# Patient Record
Sex: Male | Born: 1980 | Race: Black or African American | Hispanic: No | Marital: Married | Smoking: Never smoker
Health system: Southern US, Community
[De-identification: ages and names within clinical notes are randomized; demographics above are authoritative.]

## PROBLEM LIST (undated history)

## (undated) DIAGNOSIS — G8929 Other chronic pain: Secondary | ICD-10-CM

## (undated) DIAGNOSIS — Z973 Presence of spectacles and contact lenses: Secondary | ICD-10-CM

## (undated) DIAGNOSIS — T7840XA Allergy, unspecified, initial encounter: Secondary | ICD-10-CM

## (undated) DIAGNOSIS — R7303 Prediabetes: Secondary | ICD-10-CM

## (undated) HISTORY — DX: Presence of spectacles and contact lenses: Z97.3

## (undated) HISTORY — DX: Allergy, unspecified, initial encounter: T78.40XA

---
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
## 2005-06-18 ENCOUNTER — Emergency Department: Payer: Self-pay | Admitting: Unknown Physician Specialty

## 2005-06-18 ENCOUNTER — Other Ambulatory Visit: Payer: Self-pay

## 2005-07-04 ENCOUNTER — Ambulatory Visit: Payer: Self-pay | Admitting: Internal Medicine

## 2010-01-05 ENCOUNTER — Emergency Department: Payer: Self-pay | Admitting: Unknown Physician Specialty

## 2014-03-24 ENCOUNTER — Emergency Department (HOSPITAL_COMMUNITY): Payer: BC Managed Care – PPO

## 2014-03-24 ENCOUNTER — Encounter (HOSPITAL_COMMUNITY): Payer: Self-pay | Admitting: Emergency Medicine

## 2014-03-24 ENCOUNTER — Emergency Department (HOSPITAL_COMMUNITY)
Admission: EM | Admit: 2014-03-24 | Discharge: 2014-03-24 | Disposition: A | Discharge status: Home / Self Care | Payer: BC Managed Care – PPO | Attending: Family Medicine | Admitting: Family Medicine

## 2014-03-24 DIAGNOSIS — Y9389 Activity, other specified: Secondary | ICD-10-CM | POA: Insufficient documentation

## 2014-03-24 DIAGNOSIS — Z79899 Other long term (current) drug therapy: Secondary | ICD-10-CM | POA: Insufficient documentation

## 2014-03-24 DIAGNOSIS — X500XXA Overexertion from strenuous movement or load, initial encounter: Secondary | ICD-10-CM | POA: Insufficient documentation

## 2014-03-24 DIAGNOSIS — S39012A Strain of muscle, fascia and tendon of lower back, initial encounter: Secondary | ICD-10-CM

## 2014-03-24 DIAGNOSIS — S335XXA Sprain of ligaments of lumbar spine, initial encounter: Secondary | ICD-10-CM | POA: Diagnosis not present

## 2014-03-24 DIAGNOSIS — Y929 Unspecified place or not applicable: Secondary | ICD-10-CM | POA: Insufficient documentation

## 2014-03-24 DIAGNOSIS — IMO0002 Reserved for concepts with insufficient information to code with codable children: Secondary | ICD-10-CM | POA: Insufficient documentation

## 2014-03-24 MED ORDER — METAXALONE 800 MG PO TABS: 800.0000 mg | ORAL_TABLET | Freq: Three times a day (TID) | ORAL | Status: DC

## 2014-03-24 MED ORDER — KETOROLAC TROMETHAMINE 30 MG/ML IJ SOLN: 30.0000 mg | Freq: Once | INTRAMUSCULAR | Status: DC

## 2014-03-24 MED ORDER — METHYLPREDNISOLONE 4 MG PO KIT: PACK | ORAL | Status: DC

## 2014-03-24 NOTE — ED Provider Notes (Signed)
CSN: [id number]     Arrival date & time 03/24/14  0935 History   First MD Initiated Contact with Patient 03/24/14 [phone]     Chief Complaint  Patient presents with  . Back Pain     (Consider location/radiation/quality/duration/timing/severity/associated sxs/prior Treatment) Patient is a 33 y.o. male presenting with back pain. The history is provided by the patient.  Back Pain Location:  Lumbar spine Quality:  Shooting Radiates to:  L posterior upper leg Pain severity:  Moderate Onset quality:  Sudden Duration:  1 week Progression:  Worsening Chronicity:  Recurrent Context: lifting heavy objects and occupational injury   Context comment:  Felt twinge in back on left , now into left leg.not to foot. Associated symptoms: leg pain   Associated symptoms: no abdominal pain, no bladder incontinence, no bowel incontinence, no dysuria, no numbness, no paresthesias, no tingling and no weakness     History reviewed. No pertinent past medical history. History reviewed. No pertinent past surgical history. History reviewed. No pertinent family history. History  Substance Use Topics  . Smoking status: Never Smoker   . Smokeless tobacco: Not on file  . Alcohol Use: No    Review of Systems  Constitutional: Negative.   Gastrointestinal: Negative.  Negative for nausea, vomiting, abdominal pain and bowel incontinence.  Genitourinary: Negative for bladder incontinence, dysuria, hematuria and flank pain.  Musculoskeletal: Positive for back pain and gait problem. Negative for joint swelling and neck pain.  Skin: Negative.   Neurological: Negative for tingling, weakness, numbness and paresthesias.      Allergies  Review of patient's allergies indicates no known allergies.  Home Medications   Prior to Admission medications   Medication Sig Start Date End Date Taking? Authorizing Provider  metaxalone (SKELAXIN) 800 MG tablet Take 1 tablet (800 mg total) by mouth 3 (three) times daily.  Muscle relaxer 03/24/14   Linna Hoff, MD  methylPREDNISolone (MEDROL DOSEPAK) 4 MG tablet follow package directions, start on wed, take until finished 03/24/14   Linna Hoff, MD   BP 132/95  Pulse 63  Temp(Src) 98.6 F (37 C) (Oral)  Resp 18  Ht  (2.057 m)  Wt 315 lb (142.883 kg)  BMI 33.77 kg/m2  SpO2 100% Physical Exam  Nursing note and vitals reviewed. Constitutional: He is oriented to person, place, and time. He appears well-developed and well-nourished.  Musculoskeletal: He exhibits tenderness.       Lumbar back: He exhibits decreased range of motion, tenderness, pain and spasm. He exhibits no bony tenderness, no swelling and normal pulse.       Back:  Neurological: He is alert and oriented to person, place, and time.  Skin: Skin is warm and dry.    ED Course  Procedures (including critical care time) Labs Review Labs Reviewed - No data to display  Imaging Review No results found.   EKG Interpretation None      MDM   Final diagnoses:  Strain of lumbar paraspinal muscle, initial encounter        Linna Hoff, MD 03/31/14 1245

## 2014-03-24 NOTE — ED Provider Notes (Signed)
CSN: [id number]     Arrival date & time 03/24/14  0935 History   First MD Initiated Contact with Patient 03/24/14 [phone]     Chief Complaint  Patient presents with  . Back Pain     (Consider location/radiation/quality/duration/timing/severity/associated sxs/prior Treatment) HPI  History reviewed. No pertinent past medical history. History reviewed. No pertinent past surgical history. History reviewed. No pertinent family history. History  Substance Use Topics  . Smoking status: Never Smoker   . Smokeless tobacco: Not on file  . Alcohol Use: No    Review of Systems    Allergies  Review of patient's allergies indicates no known allergies.  Home Medications   Prior to Admission medications   Medication Sig Start Date End Date Taking? Authorizing Provider  metaxalone (SKELAXIN) 800 MG tablet Take 1 tablet (800 mg total) by mouth 3 (three) times daily. Muscle relaxer 03/24/14   Linna Hoff, MD  methylPREDNISolone (MEDROL DOSEPAK) 4 MG tablet follow package directions, start on wed, take until finished 03/24/14   Linna Hoff, MD   BP 129/81  Pulse 73  Temp(Src) 98.6 F (37 C) (Oral)  Resp 19  Ht  (2.057 m)  Wt 315 lb (142.883 kg)  BMI 33.77 kg/m2  SpO2 97% Physical Exam  ED Course  Procedures (including critical care time) Labs Review Labs Reviewed - No data to display  Imaging Review Dg Lumbar Spine Complete  03/24/2014   CLINICAL DATA:  Low back pain with radicular symptoms  EXAM: LUMBAR SPINE - COMPLETE 4+ VIEW  COMPARISON:  None.  FINDINGS: Frontal, lateral, spot lumbosacral lateral, and bilateral oblique views were obtained. There are 5 non-rib-bearing lumbar type vertebral bodies. There is no fracture or spondylolisthesis. Disc spaces appear intact. There is no appreciable facet arthropathy.  IMPRESSION: No fracture or appreciable arthropathy.   Electronically Signed   By: Bretta Bang M.D.   On: 03/24/2014 10:22  X-rays reviewed and report per  radiologist.    EKG Interpretation None      MDM   Final diagnoses:  Strain of lumbar paraspinal muscle, initial encounter        Linna Hoff, MD 03/24/14 1132

## 2014-03-24 NOTE — ED Notes (Signed)
Pt reports lower back pain for over a week since heavy lifting. Pain radiates into left leg, reports hx of back problems. Ambulatory at triage.

## 2014-03-24 NOTE — ED Notes (Signed)
C/o lower back pain radiating to left buttock and leg. Denies numbness or tingling.

## 2014-03-25 NOTE — ED Provider Notes (Signed)
CSN: [id number]     Arrival date & time 03/24/14  0935 History   First MD Initiated Contact with Patient 03/24/14 [phone]     Chief Complaint  Patient presents with  . Back Pain     (Consider location/radiation/quality/duration/timing/severity/associated sxs/prior Treatment) HPI  History reviewed. No pertinent past medical history. History reviewed. No pertinent past surgical history. History reviewed. No pertinent family history. History  Substance Use Topics  . Smoking status: Never Smoker   . Smokeless tobacco: Not on file  . Alcohol Use: No    Review of Systems    Allergies  Review of patient's allergies indicates no known allergies.  Home Medications   Prior to Admission medications   Medication Sig Start Date End Date Taking? Authorizing Provider  metaxalone (SKELAXIN) 800 MG tablet Take 1 tablet (800 mg total) by mouth 3 (three) times daily. Muscle relaxer 03/24/14   Linna Hoff, MD  methylPREDNISolone (MEDROL DOSEPAK) 4 MG tablet follow package directions, start on wed, take until finished 03/24/14   Linna Hoff, MD   BP 132/95  Pulse 63  Temp(Src) 98.6 F (37 C) (Oral)  Resp 18  Ht  (2.057 m)  Wt 315 lb (142.883 kg)  BMI 33.77 kg/m2  SpO2 100% Physical Exam  ED Course  Procedures (including critical care time) Labs Review Labs Reviewed - No data to display  Imaging Review Dg Lumbar Spine Complete  03/24/2014   CLINICAL DATA:  Low back pain with radicular symptoms  EXAM: LUMBAR SPINE - COMPLETE 4+ VIEW  COMPARISON:  None.  FINDINGS: Frontal, lateral, spot lumbosacral lateral, and bilateral oblique views were obtained. There are 5 non-rib-bearing lumbar type vertebral bodies. There is no fracture or spondylolisthesis. Disc spaces appear intact. There is no appreciable facet arthropathy.  IMPRESSION: No fracture or appreciable arthropathy.   Electronically Signed   By: Bretta Bang M.D.   On: 03/24/2014 10:22     EKG Interpretation None       MDM   Final diagnoses:  Strain of lumbar paraspinal muscle, initial encounter        Linna Hoff, MD 03/25/14 [phone]

## 2015-09-12 IMAGING — CR DG LUMBAR SPINE COMPLETE 4+V
5 series · 5 of 5 positions shown · non-contrast
Comparison: None.

CLINICAL DATA: Low back pain with radicular symptoms

EXAM:
LUMBAR SPINE - COMPLETE 4+ VIEW

[t l-spine a.p.]
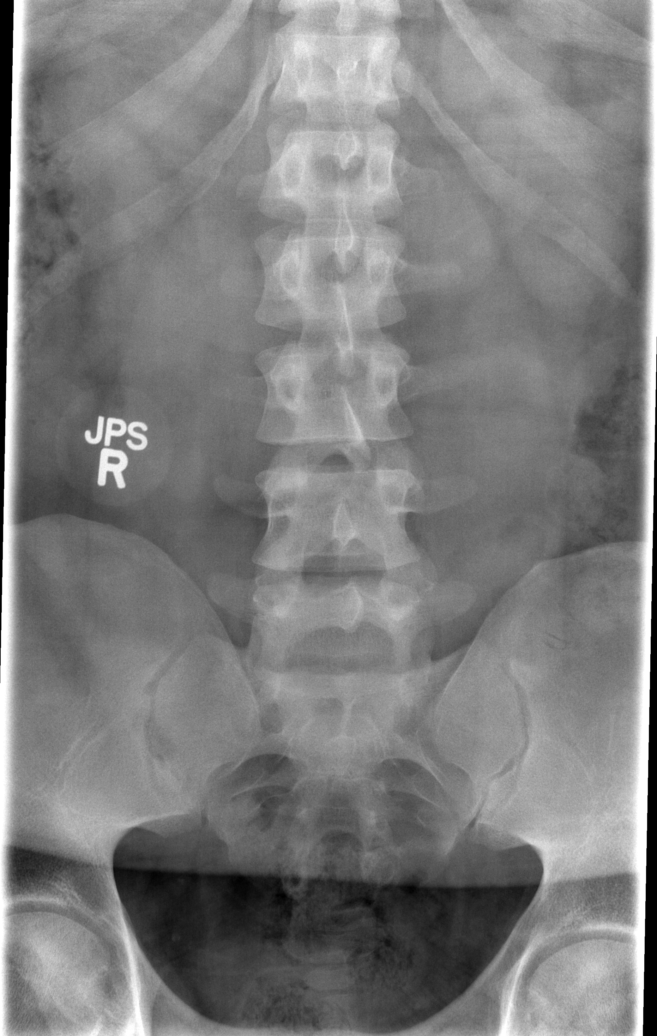

[t l-spine oblique exposure (1 of 2)]
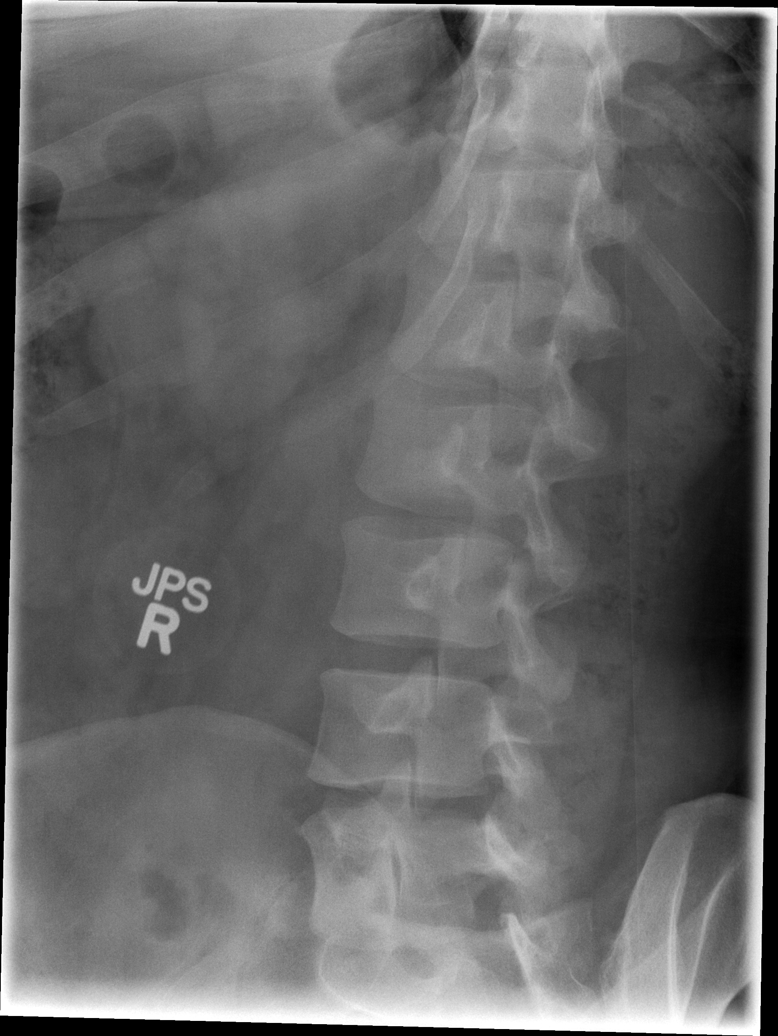

[t l-spine oblique exposure (2 of 2)]
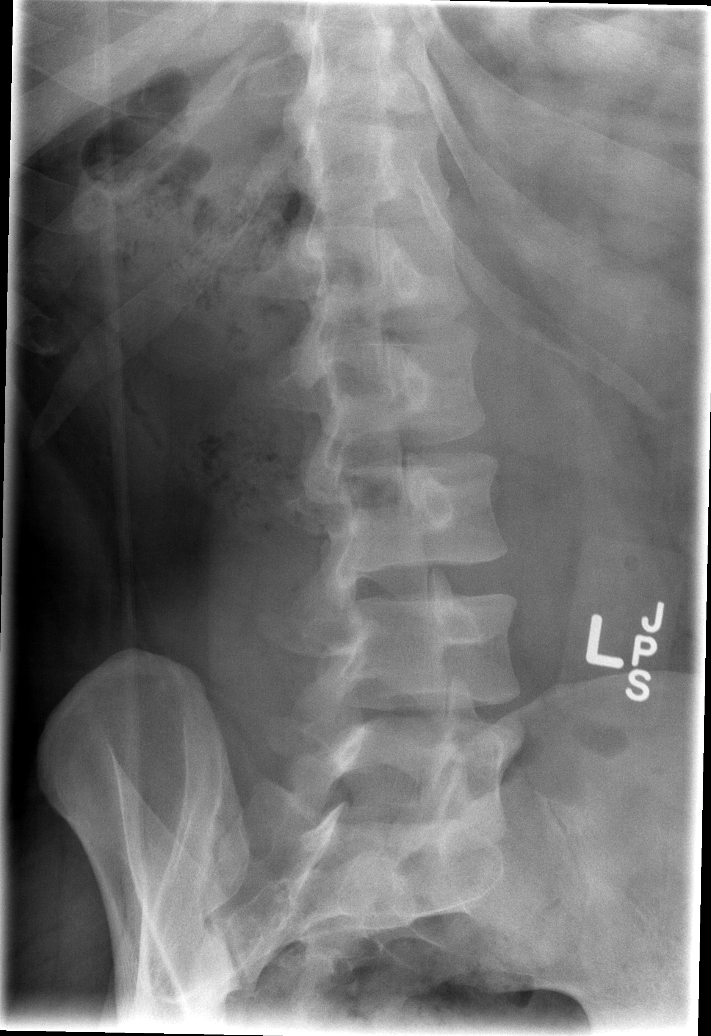

[t l-spine lat]
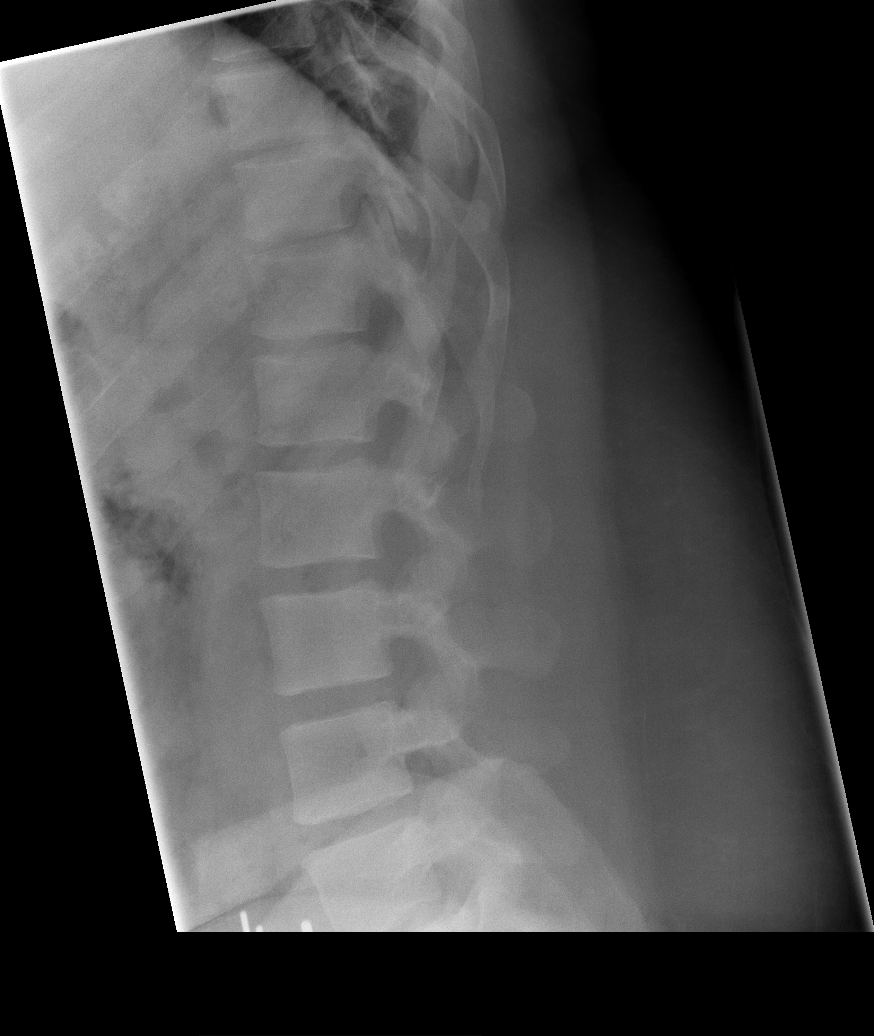

[t l-spine l5-s1 spot]
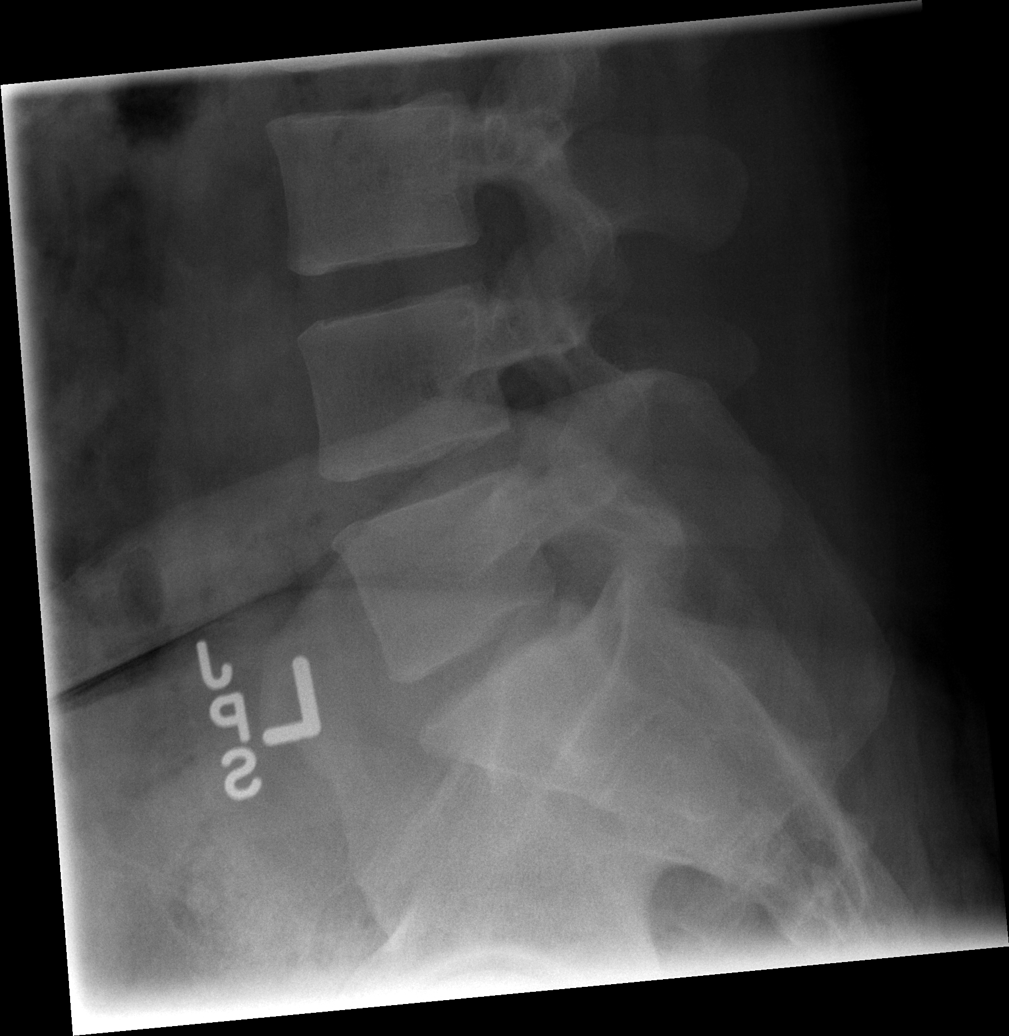

[5 of 5 positions shown; findings below may reference images not displayed]

FINDINGS: Frontal, lateral, spot lumbosacral lateral, and bilateral oblique
views were obtained. There are 5 non-rib-bearing lumbar type
vertebral bodies. There is no fracture or spondylolisthesis. Disc
spaces appear intact. There is no appreciable facet arthropathy.
IMPRESSION: No fracture or appreciable arthropathy.

## 2016-01-03 ENCOUNTER — Encounter (HOSPITAL_COMMUNITY): Payer: Self-pay | Admitting: Emergency Medicine

## 2016-01-03 ENCOUNTER — Emergency Department (HOSPITAL_COMMUNITY)
Admission: EM | Admit: 2016-01-03 | Discharge: 2016-01-03 | Disposition: A | Discharge status: Home / Self Care | Payer: BC Managed Care – PPO | Attending: Emergency Medicine | Admitting: Emergency Medicine

## 2016-01-03 DIAGNOSIS — M545 Low back pain, unspecified: Secondary | ICD-10-CM

## 2016-01-03 MED ORDER — CYCLOBENZAPRINE HCL 10 MG PO TABS: 10.0000 mg | ORAL_TABLET | Freq: Two times a day (BID) | ORAL | Status: DC | PRN

## 2016-01-03 MED ORDER — IBUPROFEN 800 MG PO TABS
800.0000 mg | ORAL_TABLET | Freq: Once | ORAL | Status: AC
  Administered 2016-01-03: 800 mg via ORAL
  Filled 2016-01-03: qty 1

## 2016-01-03 NOTE — Discharge Instructions (Signed)
Back Exercises °The following exercises strengthen the muscles that help to support the back. They also help to keep the lower back flexible. Doing these exercises can help to prevent back pain or lessen existing pain. °If you have back pain or discomfort, try doing these exercises 2-3 times each day or as told by your health care provider. When the pain goes away, do them once each day, but increase the number of times that you repeat the steps for each exercise (do more repetitions). If you do not have back pain or discomfort, do these exercises once each day or as told by your health care provider. °EXERCISES °Single Knee to Chest °Repeat these steps 3-5 times for each leg: °· Lie on your back on a firm bed or the floor with your legs extended. °· Bring one knee to your chest. Your other leg should stay extended and in contact with the floor. °· Hold your knee in place by grabbing your knee or thigh. °· Pull on your knee until you feel a gentle stretch in your lower back. °· Hold the stretch for 10-30 seconds. °· Slowly release and straighten your leg. °Pelvic Tilt °Repeat these steps 5-10 times: °· Lie on your back on a firm bed or the floor with your legs extended. °· Bend your knees so they are pointing toward the ceiling and your feet are flat on the floor. °· Tighten your lower abdominal muscles to press your lower back against the floor. This motion will tilt your pelvis so your tailbone points up toward the ceiling instead of pointing to your feet or the floor. °· With gentle tension and even breathing, hold this position for 5-10 seconds. °Cat-Cow °Repeat these steps until your lower back becomes more flexible: °· Get into a hands-and-knees position on a firm surface. Keep your hands under your shoulders, and keep your knees under your hips. You may place padding under your knees for comfort. °· Let your head hang down, and point your tailbone toward the floor so your lower back becomes rounded like the  back of a cat. °· Hold this position for 5 seconds. °· Slowly lift your head and point your tailbone up toward the ceiling so your back forms a sagging arch like the back of a cow. °· Hold this position for 5 seconds. °Press-Ups °Repeat these steps 5-10 times: °· Lie on your abdomen (face-down) on the floor. °· Place your palms near your head, about shoulder-width apart. °· While you keep your back as relaxed as possible and keep your hips on the floor, slowly straighten your arms to raise the top half of your body and lift your shoulders. Do not use your back muscles to raise your upper torso. You may adjust the placement of your hands to make yourself more comfortable. °· Hold this position for 5 seconds while you keep your back relaxed. °· Slowly return to lying flat on the floor. °Bridges °Repeat these steps 10 times: °1. Lie on your back on a firm surface. °2. Bend your knees so they are pointing toward the ceiling and your feet are flat on the floor. °3. Tighten your buttocks muscles and lift your buttocks off of the floor until your waist is at almost the same height as your knees. You should feel the muscles working in your buttocks and the back of your thighs. If you do not feel these muscles, slide your feet 1-2 inches farther away from your buttocks. °4. Hold this position for 3-5   seconds. °5. Slowly lower your hips to the starting position, and allow your buttocks muscles to relax completely. °If this exercise is too easy, try doing it with your arms crossed over your chest. °Abdominal Crunches °Repeat these steps 5-10 times: °1. Lie on your back on a firm bed or the floor with your legs extended. °2. Bend your knees so they are pointing toward the ceiling and your feet are flat on the floor. °3. Cross your arms over your chest. °4. Tip your chin slightly toward your chest without bending your neck. °5. Tighten your abdominal muscles and slowly raise your trunk (torso) high enough to lift your shoulder  blades a tiny bit off of the floor. Avoid raising your torso higher than that, because it can put too much stress on your low back and it does not help to strengthen your abdominal muscles. °6. Slowly return to your starting position. °Back Lifts °Repeat these steps 5-10 times: °1. Lie on your abdomen (face-down) with your arms at your sides, and rest your forehead on the floor. °2. Tighten the muscles in your legs and your buttocks. °3. Slowly lift your chest off of the floor while you keep your hips pressed to the floor. Keep the back of your head in line with the curve in your back. Your eyes should be looking at the floor. °4. Hold this position for 3-5 seconds. °5. Slowly return to your starting position. °SEEK MEDICAL CARE IF: °· Your back pain or discomfort gets much worse when you do an exercise. °· Your back pain or discomfort does not lessen within 2 hours after you exercise. °If you have any of these problems, stop doing these exercises right away. Do not do them again unless your health care provider says that you can. °SEEK IMMEDIATE MEDICAL CARE IF: °· You develop sudden, severe back pain. If this happens, stop doing the exercises right away. Do not do them again unless your health care provider says that you can. °  °This information is not intended to replace advice given to you by your health care provider. Make sure you discuss any questions you have with your health care provider. °  °Document Released: 08/24/2004 Document Revised: 04/07/2015 Document Reviewed: 09/10/2014 °Elsevier Interactive Patient Education ©2016 [employer] ° °Back Pain, Adult °Back pain is very common in adults. The cause of back pain is rarely dangerous and the pain often gets better over time. The cause of your back pain may not be known. Some common causes of back pain include: °· Strain of the muscles or ligaments supporting the spine. °· Wear and tear (degeneration) of the spinal disks. °· Arthritis. °· Direct injury  to the back. °For many people, back pain may return. Since back pain is rarely dangerous, most people can learn to manage this condition on their own. °HOME CARE INSTRUCTIONS °Watch your back pain for any changes. The following actions may help to lessen any discomfort you are feeling: °· Remain active. It is stressful on your back to sit or stand in one place for long periods of time. Do not sit, drive, or stand in one place for more than 30 minutes at a time. Take short walks on even surfaces as soon as you are able. Try to increase the length of time you walk each day. °· Exercise regularly as directed by your health care provider. Exercise helps your back heal faster. It also helps avoid future injury by keeping your muscles strong and flexible. °· Do not stay in   bed. Resting more than 1-2 days can delay your recovery. °· Pay attention to your body when you bend and lift. The most comfortable positions are those that put less stress on your recovering back. Always use proper lifting techniques, including: °¨ Bending your knees. °¨ Keeping the load close to your body. °¨ Avoiding twisting. °· Find a comfortable position to sleep. Use a firm mattress and lie on your side with your knees slightly bent. If you lie on your back, put a pillow under your knees. °· Avoid feeling anxious or stressed. Stress increases muscle tension and can worsen back pain. It is important to recognize when you are anxious or stressed and learn ways to manage it, such as with exercise. °· Take medicines only as directed by your health care provider. Over-the-counter medicines to reduce pain and inflammation are often the most helpful. Your health care provider may prescribe muscle relaxant drugs. These medicines help dull your pain so you can more quickly return to your normal activities and healthy exercise. °· Apply ice to the injured area: °¨ Put ice in a plastic bag. °¨ Place a towel between your skin and the bag. °¨ Leave the ice on  for 20 minutes, 2-3 times a day for the first 2-3 days. After that, ice and heat may be alternated to reduce pain and spasms. °· Maintain a healthy weight. Excess weight puts extra stress on your back and makes it difficult to maintain good posture. °SEEK MEDICAL CARE IF: °· You have pain that is not relieved with rest or medicine. °· You have increasing pain going down into the legs or buttocks. °· You have pain that does not improve in one week. °· You have night pain. °· You lose weight. °· You have a fever or chills. °SEEK IMMEDIATE MEDICAL CARE IF:  °· You develop new bowel or bladder control problems. °· You have unusual weakness or numbness in your arms or legs. °· You develop nausea or vomiting. °· You develop abdominal pain. °· You feel faint. °  °This information is not intended to replace advice given to you by your health care provider. Make sure you discuss any questions you have with your health care provider. °  °Document Released: 07/17/2005 Document Revised: 08/07/2014 Document Reviewed: 11/18/2013 °Elsevier Interactive Patient Education ©2016 [employer] ° °

## 2016-01-03 NOTE — ED Notes (Signed)
Pt c/o left low back pain x 1 wk that radiates down left leg.  States that "this happens every once in a while but this time it won't go away".  Denies any other symptoms.
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
## 2019-04-01 HISTORY — PX: NO PAST SURGERIES: SHX2092

## 2019-04-03 ENCOUNTER — Other Ambulatory Visit: Payer: Self-pay

## 2019-04-03 ENCOUNTER — Encounter: Payer: Self-pay | Admitting: Medical

## 2019-04-03 ENCOUNTER — Ambulatory Visit: Payer: Managed Care, Other (non HMO) | Admitting: Medical

## 2019-04-03 VITALS — BP 110/88 | HR 98 | Temp 98.0°F | Ht >= 80 in | Wt 348.8 lb

## 2019-04-03 DIAGNOSIS — E669 Obesity, unspecified: Secondary | ICD-10-CM | POA: Diagnosis not present

## 2019-04-03 DIAGNOSIS — M549 Dorsalgia, unspecified: Secondary | ICD-10-CM

## 2019-04-03 DIAGNOSIS — Z7189 Other specified counseling: Secondary | ICD-10-CM | POA: Diagnosis not present

## 2019-04-03 DIAGNOSIS — G8929 Other chronic pain: Secondary | ICD-10-CM

## 2019-04-03 DIAGNOSIS — Z0000 Encounter for general adult medical examination without abnormal findings: Secondary | ICD-10-CM

## 2019-04-03 DIAGNOSIS — Z7185 Encounter for immunization safety counseling: Secondary | ICD-10-CM

## 2019-04-03 NOTE — Patient Instructions (Signed)
Thanks for trusting us with your health care and for coming in for a physical today.  Exercise: I recommend exercising most days of the week using a type of exercise that you would enjoy and stick to such as walking, running, swimming, hiking, biking, aerobics, etc. This needs to be at least 30-40 minutes at a time, at least 5 days/week with moderate intensity.  This would be 60-70% of your maximum heart rate.  For example, your maximal heart rate would be 200- your age, multiplied x 0.6 to equal 60% of your maximal heart rate.    Thus, a person age 37 would have a maximum heart rate of 160.  60% of this would be 96 beats per minute.  Thus for fat burning exercise, a 38 year old would want to exercise has sustained heart rate of about 96 bpm for fat burning.  However for heart health, they would want to exercise at a higher heart rate of about 75% of maximum heart rate which would be a heart rate of about 120 beats per minute.  So I recommend a combination of doing some exercise at moderate intensity, and some exercise at vigorous intensity for heart health.   Low Carb Diet recommendations:  I recommend you drink water throughout the day.   70 ounces or 2 liters would be a good amount.  If you have been accustomed to drinking juice or soda, try water with lemon or water with lime, or try using no calorie flavor dropper.  I recommend the following as an example meal plan that includes 3 meals per day.   You can skip some meals periodically for intermittent fasting.  Breakfast (choose one): Marland Kitchen. Omelette with egg, can include a little bit of cheese, your choice of mushrooms, peppers, onions, salsa . Smoothie with handful of spinach or kale, 1 cup of milk or water, 1 cup of berries, 1 packet of artificial sweetener such as stevia . Yogurt with fruit   Mid morning snack: . 1 fruit serving and 1 protein such as 8 almonds or 8 nuts, or vegetable such as carrots and humus or other similar  vegetable   Lunch: . Salad with 3-4 ounces of lean grilled or baked meat such as fish, chicken or [place]   Mid afternoon snack: . 1 fruit serving and 1 protein such as 8 almonds or 8 nuts, or vegetable such as carrots and humus or other similar vegetable   Dinner: . Large serving of vegetables and 3-4 ounces of lean grilled or baked meat such as fish, chicken or [place] . Or vegetarian dish without meat    Avoid  . Chips, cookies, cake, donuts, soda, sweet tea, juices, candy, fast food . For now , avoid, or significantly limit grains        Below are some general recommendations I have for you:  Yearly screenings See your eye doctor yearly for routine vision care. See your dentist yearly for routine dental care including hygiene visits twice yearly. See me here yearly for a routine physical and preventative care visit    Please follow up yearly for a physical.   Preventative Care for Adults - Male      MAINTAIN REGULAR HEALTH EXAMS:  A routine yearly physical is a good way to check in with your primary care provider about your health and preventive screening. It is also an opportunity to share updates about your health and any concerns you have, and receive a thorough all-over exam.   Most health  insurance companies pay for at least some preventative services.  Check with your health plan for specific coverages.  WHAT PREVENTATIVE SERVICES DO MEN NEED?  Adult men should have their weight and blood pressure checked regularly.   Men age 55 and older should have their cholesterol levels checked regularly.  Beginning at age 16 and continuing to age 82, men should be screened for colorectal cancer.  Certain people may need continued testing until age 56.  Updating vaccinations is part of preventative care.  Vaccinations help protect against diseases such as the flu.  Osteoporosis is a disease in which the bones lose minerals and strength as we age. Men ages 68 and  over should discuss this with their caregivers  Lab tests are generally done as part of preventative care to screen for anemia and blood disorders, to screen for problems with the kidneys and liver, to screen for bladder problems, to check blood sugar, and to check your cholesterol level.  Preventative services generally include counseling about diet, exercise, avoiding tobacco, drugs, excessive alcohol consumption, and sexually transmitted infections.    GENERAL RECOMMENDATIONS FOR GOOD HEALTH:  Healthy diet:  Eat a variety of foods, including fruit, vegetables, animal or vegetable protein, such as meat, fish, chicken, and eggs, or beans, lentils, tofu, and grains, such as rice.  Drink plenty of water daily.  Decrease saturated fat in the diet, avoid lots of red meat, processed foods, sweets, fast foods, and fried foods.  Exercise:  Aerobic exercise helps maintain good heart health. At least 30-40 minutes of moderate-intensity exercise is recommended. For example, a brisk walk that increases your heart rate and breathing. This should be done on most days of the week.   Find a type of exercise or a variety of exercises that you enjoy so that it becomes a part of your daily life.  Examples are running, walking, swimming, water aerobics, and biking.  For motivation and support, explore group exercise such as aerobic class, spin class, Zumba, Yoga,or  martial arts, etc.    Set exercise goals for yourself, such as a certain weight goal, walk or run in a race such as a 5k walk/run.  Speak to your primary care provider about exercise goals.  Disease prevention:  If you smoke or chew tobacco, find out from your caregiver how to quit. It can literally save your life, no matter how long you have been a tobacco user. If you do not use tobacco, never begin.   Maintain a healthy diet and normal weight. Increased weight leads to problems with blood pressure and diabetes.   The Body Mass Index or BMI  is a way of measuring how much of your body is fat. Having a BMI above 27 increases the risk of heart disease, diabetes, hypertension, stroke and other problems related to obesity. Your caregiver can help determine your BMI and based on it develop an exercise and dietary program to help you achieve or maintain this important measurement at a healthful level.  High blood pressure causes heart and blood vessel problems.  Persistent high blood pressure should be treated with medicine if weight loss and exercise do not work.   Fat and cholesterol leaves deposits in your arteries that can block them. This causes heart disease and vessel disease elsewhere in your body.  If your cholesterol is found to be high, or if you have heart disease or certain other medical conditions, then you may need to have your cholesterol monitored frequently and be treated  with medication.   Ask if you should have a cardiac stress test if your history suggests this. A stress test is a test done on a treadmill that looks for heart disease. This test can find disease prior to there being a problem.  Osteoporosis is a disease in which the bones lose minerals and strength as we age. This can result in serious bone fractures. Risk of osteoporosis can be identified using a bone density scan. Men ages 2965 and over should discuss this with their caregivers. Ask your caregiver whether you should be taking a calcium supplement and Vitamin D, to reduce the rate of osteoporosis.   Avoid drinking alcohol in excess (more than two drinks per day).  Avoid use of street drugs. Do not share needles with anyone. Ask for professional help if you need assistance or instructions on stopping the use of alcohol, cigarettes, and/or drugs.  Brush your teeth twice a day with fluoride toothpaste, and floss once a day. Good oral hygiene prevents tooth decay and gum disease. The problems can be painful, unattractive, and can cause other health problems. Visit  your dentist for a routine oral and dental check up and preventive care every 6-12 months.   Look at your skin regularly.  Use a mirror to look at your back. Notify your caregivers of changes in moles, especially if there are changes in shapes, colors, a size larger than a pencil eraser, an irregular border, or development of new moles.  Safety:  Use seatbelts 100% of the time, whether driving or as a passenger.  Use safety devices such as hearing protection if you work in environments with loud noise or significant background noise.  Use safety glasses when doing any work that could send debris in to the eyes.  Use a helmet if you ride a bike or motorcycle.  Use appropriate safety gear for contact sports.  Talk to your caregiver about gun safety.  Use sunscreen with a SPF (or skin protection factor) of 15 or greater.  Lighter skinned people are at a greater risk of skin cancer. Don't forget to also wear sunglasses in order to protect your eyes from too much damaging sunlight. Damaging sunlight can accelerate cataract formation.   Practice safe sex. Use condoms. Condoms are used for birth control and to help reduce the spread of sexually transmitted infections (or STIs).  Some of the STIs are gonorrhea (the clap), chlamydia, syphilis, trichomonas, herpes, HPV (human papilloma virus) and HIV (human immunodeficiency virus) which causes AIDS. The herpes, HIV and HPV are viral illnesses that have no cure. These can result in disability, cancer and death.   Keep carbon monoxide and smoke detectors in your home functioning at all times. Change the batteries every 6 months or use a model that plugs into the wall.   Vaccinations:  Stay up to date with your tetanus shots and other required immunizations. You should have a booster for tetanus every 10 years. Be sure to get your flu shot every year, since 5%-20% of the [place] population comes down with the flu. The flu vaccine changes each year, so being  vaccinated once is not enough. Get your shot in the fall, before the flu season peaks.   Other vaccines to consider:  Human Papilloma Virus or HPV causes cancer of the cervix, and other infections that can be transmitted from person to person. There is a vaccine for HPV, and males should get immunized between the ages of 4711 and 3826. It requires a  series of 3 shots.   Pneumococcal vaccine to protect against certain types of pneumonia.  This is normally recommended for adults age 67 or older.  However, adults younger than 37 years old with certain underlying conditions such as diabetes, heart or lung disease should also receive the vaccine.  Shingles vaccine to protect against Varicella Zoster if you are older than age 52, or younger than 37 years old with certain underlying illness.  If you have not had the Shingrix vaccine, please call your insurer to inquire about coverage for the Shingrix vaccine given in 2 doses.   Some insurers cover this vaccine after age 24, some cover this after age 1.  If your insurer covers this, then call to schedule appointment to have this vaccine here  Hepatitis A vaccine to protect against a form of infection of the liver by a virus acquired from food.  Hepatitis B vaccine to protect against a form of infection of the liver by a virus acquired from blood or body fluids, particularly if you work in health care.  If you plan to travel internationally, check with your local health department for specific vaccination recommendations.   What should I know about cancer screening? Many types of cancers can be detected early and may often be prevented. Lung Cancer  You should be screened every year for lung cancer if: ? You are a current smoker who has smoked for at least 30 years. ? You are a former smoker who has quit within the past 15 years.  Talk to your health care provider about your screening options, when you should start screening, and how often you should be  screened.  Colorectal Cancer  Routine colorectal cancer screening usually begins at 37 years of age and should be repeated every 5-10 years until you are 37 years old. You may need to be screened more often if early forms of precancerous polyps or small growths are found. Your health care provider may recommend screening at an earlier age if you have risk factors for colon cancer.  Your health care provider may recommend using home test kits to check for hidden blood in the stool.  A small camera at the end of a tube can be used to examine your colon (sigmoidoscopy or colonoscopy). This checks for the earliest forms of colorectal cancer.  Prostate and Testicular Cancer  Depending on your age and overall health, your health care provider may do certain tests to screen for prostate and testicular cancer.  Talk to your health care provider about any symptoms or concerns you have about testicular or prostate cancer.  Skin Cancer  Check your skin from head to toe regularly.  Tell your health care provider about any new moles or changes in moles, especially if: ? There is a change in a mole's size, shape, or color. ? You have a mole that is larger than a pencil eraser.  Always use sunscreen. Apply sunscreen liberally and repeat throughout the day.  Protect yourself by wearing long sleeves, pants, a wide-brimmed hat, and sunglasses when outside.
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
## 2019-04-04 LAB — COMPREHENSIVE METABOLIC PANEL
ALT: 23 IU/L (ref 0–44)
AST: 20 IU/L (ref 0–40)
Albumin/Globulin Ratio: 2.2 (ref 1.2–2.2)
Albumin: 4.8 g/dL (ref 4.0–5.0)
Alkaline Phosphatase: 63 IU/L (ref 39–117)
BUN/Creatinine Ratio: 12 (ref 9–20)
BUN: 13 mg/dL (ref 6–20)
Bilirubin Total: 0.6 mg/dL (ref 0.0–1.2)
CO2: 26 mmol/L (ref 20–29)
Calcium: 10 mg/dL (ref 8.7–10.2)
Chloride: 101 mmol/L (ref 96–106)
Creatinine, Ser: 1.13 mg/dL (ref 0.76–1.27)
GFR calc Af Amer: 95 mL/min/{1.73_m2} (ref >59)
GFR calc non Af Amer: 83 mL/min/{1.73_m2} (ref >59)
Globulin, Total: 2.2 g/dL (ref 1.5–4.5)
Glucose: 94 mg/dL (ref 65–99)
Potassium: 4.3 mmol/L (ref 3.5–5.2)
Sodium: 143 mmol/L (ref 134–144)
Total Protein: 7 g/dL (ref 6.0–8.5)

## 2019-04-04 LAB — CBC WITH DIFFERENTIAL/PLATELET
Basophils Absolute: 0.1 10*3/uL (ref 0.0–0.2)
Basos: 1 %
EOS (ABSOLUTE): 0.2 10*3/uL (ref 0.0–0.4)
Eos: 4 %
Hematocrit: 44 % (ref 37.5–51.0)
Hemoglobin: 14.7 g/dL (ref 13.0–17.7)
Immature Grans (Abs): 0 10*3/uL (ref 0.0–0.1)
Immature Granulocytes: 0 %
Lymphocytes Absolute: 2.5 10*3/uL (ref 0.7–3.1)
Lymphs: 42 %
MCH: 27.6 pg (ref 26.6–33.0)
MCHC: 33.4 g/dL (ref 31.5–35.7)
MCV: 83 fL (ref 79–97)
Monocytes Absolute: 0.6 10*3/uL (ref 0.1–0.9)
Monocytes: 10 %
Neutrophils Absolute: 2.7 10*3/uL (ref 1.4–7.0)
Neutrophils: 43 %
Platelets: 289 10*3/uL (ref 150–450)
RBC: 5.32 x10E6/uL (ref 4.14–5.80)
RDW: 13.5 % (ref 11.6–15.4)
WBC: 6 10*3/uL (ref 3.4–10.8)

## 2019-04-04 LAB — HEMOGLOBIN A1C
Est. average glucose Bld gHb Est-mCnc: 117 mg/dL
Hgb A1c MFr Bld: 5.7 % — ABNORMAL HIGH (ref 4.8–5.6)

## 2019-04-04 LAB — LIPID PANEL
Chol/HDL Ratio: 4.7 ratio (ref 0.0–5.0)
Cholesterol, Total: 197 mg/dL (ref 100–199)
HDL: 42 mg/dL (ref >39)
LDL Chol Calc (NIH): 136 mg/dL — ABNORMAL HIGH (ref 0–99)
Triglycerides: 107 mg/dL (ref 0–149)
VLDL Cholesterol Cal: 19 mg/dL (ref 5–40)

## 2019-04-04 LAB — TSH: TSH: 3.12 u[IU]/mL (ref 0.450–4.500)

## 2019-08-29 ENCOUNTER — Ambulatory Visit: Payer: Managed Care, Other (non HMO) | Admitting: Medical

## 2019-08-29 ENCOUNTER — Encounter: Payer: Self-pay | Admitting: Medical

## 2019-08-29 ENCOUNTER — Other Ambulatory Visit: Payer: Self-pay

## 2019-08-29 VITALS — BP 132/88 | HR 82 | Temp 98.0°F | Ht >= 80 in | Wt 352.4 lb

## 2019-08-29 DIAGNOSIS — M549 Dorsalgia, unspecified: Secondary | ICD-10-CM

## 2019-08-29 DIAGNOSIS — G8929 Other chronic pain: Secondary | ICD-10-CM

## 2019-08-29 DIAGNOSIS — E669 Obesity, unspecified: Secondary | ICD-10-CM | POA: Diagnosis not present

## 2019-08-29 DIAGNOSIS — E7800 Pure hypercholesterolemia, unspecified: Secondary | ICD-10-CM | POA: Diagnosis not present

## 2019-08-29 MED ORDER — QSYMIA 7.5-46 MG PO CP24: 1.0000 | ORAL_CAPSULE | ORAL | 0 refills | Status: DC
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
## 2019-12-16 ENCOUNTER — Other Ambulatory Visit: Payer: Self-pay

## 2019-12-16 ENCOUNTER — Encounter: Payer: Self-pay | Admitting: Medical

## 2019-12-16 ENCOUNTER — Ambulatory Visit: Payer: Managed Care, Other (non HMO) | Admitting: Medical

## 2019-12-16 VITALS — BP 120/80 | HR 65 | Temp 98.4°F | Ht >= 80 in | Wt 343.4 lb

## 2019-12-16 DIAGNOSIS — L729 Follicular cyst of the skin and subcutaneous tissue, unspecified: Secondary | ICD-10-CM | POA: Diagnosis not present

## 2019-12-16 NOTE — Progress Notes (Signed)
Subjective:  Grant Mays is a 39 y.o. male who presents for Chief Complaint  Patient presents with  . Mass    colar bone area left side      Here today for concern about lump under the skin.  He noticed this a few days ago on the left collarbone area.  His wife is on the phone speaker phone today to listen in.  He got the Covid vaccine 2 weeks ago  He does happen to have a itch on his left chest and felt the lump.  The lump is mobile.  It doesn't hurt unless he is pressing on it.  He denies any other lump of the chest wall breast or underarms.  He is a non-smoker.  Otherwise he feels fine.  He has been exercising of lately.  He didn't know if the exercise had caused this.  No other aggravating or relieving factors.    No other c/o.  The following portions of the patient's history were reviewed and updated as appropriate: allergies, current medications, past family history, past medical history, past social history, past surgical history and problem list.  ROS Otherwise as in subjective above  Objective: BP 120/80   Pulse 65   Temp 98.4 F (36.9 C)   Ht 6\' 8"  (2.032 m)   Wt (!) 343 lb 6.4 oz (155.8 kg)   SpO2 98%   BMI 37.72 kg/m   General appearance: alert, no distress, well developed, well nourished Just over the left collarbone approximately distal fifth is a mobile approximately 5 mm round cystic structure subcutaneous.  There is no erythema no tenderness no fluctuance no warmth.  It does not feel dense or mass like such as lymph node or mass, seems consistent with a benign cyst   Assessment: Encounter Diagnosis  Name Primary?  . Cyst of skin Yes     Plan: We discussed the findings that seems consistent with cyst in the subcutaneous tissue that is mobile and nontender.  It is benign appearing.  I reassured him  Tamara was seen today for mass.  Diagnoses and all orders for this visit:  Cyst of skin    Follow up: As needed

## 2020-03-25 ENCOUNTER — Telehealth: Payer: Self-pay | Admitting: Medical

## 2020-03-25 ENCOUNTER — Encounter: Payer: Managed Care, Other (non HMO) | Admitting: Medical

## 2020-03-25 NOTE — Telephone Encounter (Signed)
Forwarding

## 2020-03-25 NOTE — Telephone Encounter (Signed)
Call and see why he no showed a DOT???    This patient no showed for their appointment today.Which of the following is necessary for this patient.   A) No follow-up necessary   B) Follow-up urgent. Locate Patient Immediately.   C) Follow-up necessary. Contact patient and Schedule visit in ____ Days.   D) Follow-up Advised. Contact patient and Schedule visit in ____ Days.   E) Please Send no show letter to patient. Charge no show fee if no show was a CPE.

## 2020-03-26 NOTE — Telephone Encounter (Signed)
Please send patient a no show letter.

## 2020-03-29 ENCOUNTER — Encounter: Payer: Self-pay | Admitting: Medical

## 2021-08-18 ENCOUNTER — Ambulatory Visit: Payer: Managed Care, Other (non HMO) | Admitting: Medical

## 2021-08-18 ENCOUNTER — Encounter: Payer: Self-pay | Admitting: Medical

## 2021-08-18 ENCOUNTER — Other Ambulatory Visit: Payer: Self-pay

## 2021-08-18 VITALS — BP 122/80 | HR 85 | Ht >= 80 in | Wt 361.8 lb

## 2021-08-18 DIAGNOSIS — G8929 Other chronic pain: Secondary | ICD-10-CM

## 2021-08-18 DIAGNOSIS — Z13220 Encounter for screening for lipoid disorders: Secondary | ICD-10-CM

## 2021-08-18 DIAGNOSIS — R635 Abnormal weight gain: Secondary | ICD-10-CM

## 2021-08-18 DIAGNOSIS — Z0000 Encounter for general adult medical examination without abnormal findings: Secondary | ICD-10-CM

## 2021-08-18 DIAGNOSIS — Z131 Encounter for screening for diabetes mellitus: Secondary | ICD-10-CM

## 2021-08-18 DIAGNOSIS — E669 Obesity, unspecified: Secondary | ICD-10-CM

## 2021-08-18 DIAGNOSIS — Z7185 Encounter for immunization safety counseling: Secondary | ICD-10-CM

## 2021-08-18 DIAGNOSIS — M549 Dorsalgia, unspecified: Secondary | ICD-10-CM

## 2021-08-18 DIAGNOSIS — Z1329 Encounter for screening for other suspected endocrine disorder: Secondary | ICD-10-CM

## 2021-08-18 LAB — LIPID PANEL
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
## 2021-08-18 NOTE — Patient Instructions (Signed)
Please go to [facility] for your back xray.   Their hours are 8am - 4:30 pm Monday - Friday.  Take your insurance card with you. ° °[facility] °[phone] ° °[address], [address] °[place], [place] [address] ° °[address] °[place], [place] [address]  °

## 2021-08-19 ENCOUNTER — Other Ambulatory Visit: Payer: Self-pay | Admitting: Internal Medicine

## 2021-08-19 DIAGNOSIS — E669 Obesity, unspecified: Secondary | ICD-10-CM

## 2021-08-19 DIAGNOSIS — R635 Abnormal weight gain: Secondary | ICD-10-CM

## 2021-08-19 LAB — LIPID PANEL
Chol/HDL Ratio: 4.3 ratio (ref 0.0–5.0)
Cholesterol, Total: 208 mg/dL — ABNORMAL HIGH (ref 100–199)
HDL: 48 mg/dL (ref >39)
LDL Chol Calc (NIH): 145 mg/dL — ABNORMAL HIGH (ref 0–99)
Triglycerides: 83 mg/dL (ref 0–149)
VLDL Cholesterol Cal: 15 mg/dL (ref 5–40)

## 2021-08-19 LAB — URINALYSIS, ROUTINE W REFLEX MICROSCOPIC
Bilirubin, UA: NEGATIVE
Glucose, UA: NEGATIVE
Ketones, UA: NEGATIVE
Leukocytes,UA: NEGATIVE
Nitrite, UA: NEGATIVE
Protein,UA: NEGATIVE
RBC, UA: NEGATIVE
Specific Gravity, UA: 1.021 (ref 1.005–1.030)
Urobilinogen, Ur: 0.2 mg/dL (ref 0.2–1.0)
pH, UA: 6 (ref 5.0–7.5)

## 2021-08-19 LAB — COMPREHENSIVE METABOLIC PANEL
ALT: 26 IU/L (ref 0–44)
AST: 20 IU/L (ref 0–40)
Albumin/Globulin Ratio: 1.7 (ref 1.2–2.2)
Albumin: 4.5 g/dL (ref 4.0–5.0)
Alkaline Phosphatase: 72 IU/L (ref 44–121)
BUN/Creatinine Ratio: 11 (ref 9–20)
BUN: 12 mg/dL (ref 6–20)
Bilirubin Total: 0.5 mg/dL (ref 0.0–1.2)
CO2: 25 mmol/L (ref 20–29)
Calcium: 9.9 mg/dL (ref 8.7–10.2)
Chloride: 102 mmol/L (ref 96–106)
Creatinine, Ser: 1.06 mg/dL (ref 0.76–1.27)
Globulin, Total: 2.6 g/dL (ref 1.5–4.5)
Glucose: 81 mg/dL (ref 70–99)
Potassium: 4.1 mmol/L (ref 3.5–5.2)
Sodium: 141 mmol/L (ref 134–144)
Total Protein: 7.1 g/dL (ref 6.0–8.5)
eGFR: 92 mL/min/{1.73_m2} (ref >59)

## 2021-08-19 LAB — CBC
Hematocrit: 44.3 % (ref 37.5–51.0)
Hemoglobin: 14.8 g/dL (ref 13.0–17.7)
MCH: 27.4 pg (ref 26.6–33.0)
MCHC: 33.4 g/dL (ref 31.5–35.7)
MCV: 82 fL (ref 79–97)
Platelets: 280 10*3/uL (ref 150–450)
RBC: 5.4 x10E6/uL (ref 4.14–5.80)
RDW: 13.9 % (ref 11.6–15.4)
WBC: 4.9 10*3/uL (ref 3.4–10.8)

## 2021-08-19 LAB — HEMOGLOBIN A1C
Est. average glucose Bld gHb Est-mCnc: 123 mg/dL
Hgb A1c MFr Bld: 5.9 % — ABNORMAL HIGH (ref 4.8–5.6)

## 2021-08-19 LAB — TSH: TSH: 2.24 u[IU]/mL (ref 0.450–4.500)

## 2021-11-16 ENCOUNTER — Ambulatory Visit: Payer: Managed Care, Other (non HMO) | Admitting: Medical

## 2021-12-01 ENCOUNTER — Ambulatory Visit: Payer: Self-pay | Admitting: Medical

## 2021-12-05 ENCOUNTER — Observation Stay
Admission: EM | Admit: 2021-12-05 | Discharge: 2021-12-07 | Disposition: A | Discharge status: Home / Self Care | Payer: 59 | Attending: Student | Admitting: Student

## 2021-12-05 ENCOUNTER — Other Ambulatory Visit: Payer: Self-pay

## 2021-12-05 ENCOUNTER — Observation Stay: Payer: 59

## 2021-12-05 DIAGNOSIS — K254 Chronic or unspecified gastric ulcer with hemorrhage: Principal | ICD-10-CM | POA: Insufficient documentation

## 2021-12-05 DIAGNOSIS — E669 Obesity, unspecified: Secondary | ICD-10-CM | POA: Diagnosis present

## 2021-12-05 DIAGNOSIS — M549 Dorsalgia, unspecified: Secondary | ICD-10-CM | POA: Diagnosis not present

## 2021-12-05 DIAGNOSIS — D62 Acute posthemorrhagic anemia: Secondary | ICD-10-CM | POA: Diagnosis present

## 2021-12-05 DIAGNOSIS — B9681 Helicobacter pylori [H. pylori] as the cause of diseases classified elsewhere: Secondary | ICD-10-CM | POA: Diagnosis not present

## 2021-12-05 DIAGNOSIS — K2951 Unspecified chronic gastritis with bleeding: Secondary | ICD-10-CM | POA: Diagnosis not present

## 2021-12-05 DIAGNOSIS — K922 Gastrointestinal hemorrhage, unspecified: Secondary | ICD-10-CM | POA: Diagnosis present

## 2021-12-05 DIAGNOSIS — R55 Syncope and collapse: Secondary | ICD-10-CM | POA: Diagnosis present

## 2021-12-05 DIAGNOSIS — G8929 Other chronic pain: Secondary | ICD-10-CM | POA: Diagnosis present

## 2021-12-05 DIAGNOSIS — Z6839 Body mass index (BMI) 39.0-39.9, adult: Secondary | ICD-10-CM | POA: Diagnosis not present

## 2021-12-05 HISTORY — DX: Prediabetes: R73.03

## 2021-12-05 LAB — BASIC METABOLIC PANEL
Anion gap: 8 (ref 5–15)
BUN: 36 mg/dL — ABNORMAL HIGH (ref 6–20)
CO2: 26 mmol/L (ref 22–32)
Calcium: 9.4 mg/dL (ref 8.9–10.3)
Chloride: 106 mmol/L (ref 98–111)
Creatinine, Ser: 0.96 mg/dL (ref 0.61–1.24)
GFR, Estimated: >60 mL/min (ref >60)
Glucose, Bld: 108 mg/dL — ABNORMAL HIGH (ref 70–99)
Potassium: 4.3 mmol/L (ref 3.5–5.1)
Sodium: 140 mmol/L (ref 135–145)

## 2021-12-05 LAB — URINALYSIS, ROUTINE W REFLEX MICROSCOPIC
Bilirubin Urine: NEGATIVE
Glucose, UA: NEGATIVE mg/dL
Hgb urine dipstick: NEGATIVE
Ketones, ur: 5 mg/dL — AB
Leukocytes,Ua: NEGATIVE
Nitrite: NEGATIVE
Protein, ur: NEGATIVE mg/dL
Specific Gravity, Urine: 1.017 (ref 1.005–1.030)
pH: 5 (ref 5.0–8.0)

## 2021-12-05 LAB — D-DIMER, QUANTITATIVE: D-Dimer, Quant: 0.42 ug/mL-FEU (ref 0.00–0.50)

## 2021-12-05 LAB — TYPE AND SCREEN
ABO/RH(D): O POS
Antibody Screen: NEGATIVE

## 2021-12-05 LAB — HEPATIC FUNCTION PANEL
ALT: 24 U/L (ref 0–44)
AST: 22 U/L (ref 15–41)
Albumin: 4 g/dL (ref 3.5–5.0)
Alkaline Phosphatase: 51 U/L (ref 38–126)
Bilirubin, Direct: <0.1 mg/dL (ref 0.0–0.2)
Total Bilirubin: 0.6 mg/dL (ref 0.3–1.2)
Total Protein: 7 g/dL (ref 6.5–8.1)

## 2021-12-05 LAB — CBC
HCT: 36.3 % — ABNORMAL LOW (ref 39.0–52.0)
Hemoglobin: 11.9 g/dL — ABNORMAL LOW (ref 13.0–17.0)
MCH: 27.6 pg (ref 26.0–34.0)
MCHC: 32.8 g/dL (ref 30.0–36.0)
MCV: 84.2 fL (ref 80.0–100.0)
Platelets: 287 10*3/uL (ref 150–400)
RBC: 4.31 MIL/uL (ref 4.22–5.81)
RDW: 13.2 % (ref 11.5–15.5)
WBC: 11.2 10*3/uL — ABNORMAL HIGH (ref 4.0–10.5)
nRBC: 0 % (ref 0.0–0.2)

## 2021-12-05 LAB — TROPONIN I (HIGH SENSITIVITY): Troponin I (High Sensitivity): 4 ng/L (ref <18)

## 2021-12-05 MED ORDER — ONDANSETRON HCL 4 MG PO TABS: 4.0000 mg | ORAL_TABLET | Freq: Four times a day (QID) | ORAL | Status: DC | PRN

## 2021-12-05 MED ORDER — ONDANSETRON HCL 4 MG/2ML IJ SOLN: 4.0000 mg | Freq: Four times a day (QID) | INTRAMUSCULAR | Status: DC | PRN

## 2021-12-05 MED ORDER — ACETAMINOPHEN 650 MG RE SUPP: 650.0000 mg | Freq: Four times a day (QID) | RECTAL | Status: DC | PRN

## 2021-12-05 MED ORDER — PANTOPRAZOLE INFUSION (NEW) - SIMPLE MED
8.0000 mg/h | INTRAVENOUS | Status: DC
  Administered 2021-12-05 – 2021-12-07 (×4): 8 mg/h via INTRAVENOUS
  Filled 2021-12-05 (×4): qty 100

## 2021-12-05 MED ORDER — PANTOPRAZOLE 80MG IVPB - SIMPLE MED
80.0000 mg | Freq: Once | INTRAVENOUS | Status: AC
  Administered 2021-12-05: 80 mg via INTRAVENOUS
  Filled 2021-12-05: qty 100

## 2021-12-05 MED ORDER — LIDOCAINE 5 % EX PTCH
1.0000 | MEDICATED_PATCH | Freq: Every day | CUTANEOUS | Status: DC | PRN
  Filled 2021-12-05: qty 1

## 2021-12-05 MED ORDER — ACETAMINOPHEN 325 MG PO TABS
650.0000 mg | ORAL_TABLET | Freq: Four times a day (QID) | ORAL | Status: DC | PRN
  Administered 2021-12-05: 650 mg via ORAL
  Filled 2021-12-05: qty 2

## 2021-12-05 MED ORDER — PANTOPRAZOLE SODIUM 40 MG IV SOLR: 40.0000 mg | Freq: Two times a day (BID) | INTRAVENOUS | Status: DC

## 2021-12-05 NOTE — Assessment & Plan Note (Addendum)
-   Check H. pylori antigen stool and H. pylori IgM and IgG and IgA antibody ?- Continue Protonix GGT ?- Consulted GI specialist, Dr. Mia Creek who is aware of patient ?-Patient is going for EGD later today. ?-Monitor hemoglobin ?-Transfuse if below 8 ?

## 2021-12-05 NOTE — Assessment & Plan Note (Addendum)
-   Baseline hemoglobin was 14.8, on 08/18/2021 ?- Dropped to 10.1 this morning. ?-Monitor hemoglobin ?-Transfuse if below 8 ?

## 2021-12-05 NOTE — Assessment & Plan Note (Signed)
-   Lidocaine application of the back as needed ordered ?

## 2021-12-05 NOTE — Assessment & Plan Note (Addendum)
-   Presumed secondary to upper GI bleed ?- Currently at baseline. ?Chest x-ray without any significant abnormality. ?
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
## 2021-12-05 NOTE — ED Provider Notes (Signed)
EKG reviewed shows sinus tachycardia, ventricular rate 112.  PR 146, QRS 92, QTc 458.  Nonspecific T wave inversions noted, possibly LVH related.  However, patient does seem to have an S1Q3T3 with mild tachycardia.  This does not appear on his previous EKG.  While story is somewhat atypical based on my review of notes, I have requested nurse to add on a D-dimer as well as troponin. ?  ?Shaune Pollack, MD ?12/05/21 1555 ? ?

## 2021-12-05 NOTE — ED Triage Notes (Signed)
Pt comes into the ED via [facility] from work with c/o near syncopal episode while at work today, pt arrives a/ox4, states he got hot and sweaty. Left sided HA, states he had black stool this morning ?140/100 ?HR90 ?100%RA ?CBG119 ?
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
## 2021-12-05 NOTE — ED Provider Triage Note (Signed)
Emergency Medicine Provider Triage Evaluation Note ? ?Mccartney Katheren Puller, a 41 y.o. male  was evaluated in triage.  Pt complains of with medical history of prediabetes, presents to the ED for evaluation of near syncope and dizziness.  Patient reports onset of symptoms yesterday, when he had intermittent episodes of feeling dizzy.  Patient today after he had similar symptoms at work.  Patient did have a near syncopal episode when he stood up and felt the room going dark.  He does also endorse a large dark tarry stool this morning.  Patient denies any current chest pain, shortness of breath, fever, chills, sweats.  Denies any history of gastric ulcers, colitis, or GI bleed.  ? ?Review of Systems  ?Positive: Fatigue, syncope, melena ?Negative: FCS ? ?Physical Exam  ?There were no vitals taken for this visit. ?Gen:   Awake, no distress   ?Resp:  Normal effort CTA ?MSK:   Moves extremities without difficulty  ?Other:  Soft, nontender ? ?Medical Decision Making  ?Medically screening exam initiated at 3:48 PM.  Appropriate orders placed.  Duvid L [place] was informed that the remainder of the evaluation will be completed by another provider, this initial triage assessment does not replace that evaluation, and the importance of remaining in the ED until their evaluation is complete. ? ?Patient to the ED for evaluation of a large dark tarry stool as well as 2 days of intermittent episodes of fatigue, dizziness, and syncope. ?  ?Melvenia Needles, PA-C ?12/05/21 1549 ? ?

## 2021-12-05 NOTE — Assessment & Plan Note (Addendum)
Estimated body mass index is 39.74 kg/m? as calculated from the following: ?  Height as of this encounter: 6\' 8"  (2.032 m). ?  Weight as of this encounter: 164.1 kg. ? ?- Counseled patient on safe and healthy weight loss ?- Patient states he is trying, he states that his wife and his primary care doctor has been telling him that he needs to lose weight ?

## 2021-12-05 NOTE — Hospital Course (Addendum)
41 year old male with medical history of obesity, currently not prescribed medications and not taking medications, who presents emergency department for chief concerns of syncopal event at work. ? ?Initial vitals in the emergency department showed temperature of 98.3, respiration 18, heart rate 97, blood pressure 138/93, SPO2 99% on room air. ? ?Serum sodium 140, potassium 4.3, chloride 106, bicarb 26, BUN of 36, serum creatinine 0.96, GFR greater than 60, nonfasting blood glucose 108, WBC 11.2, hemoglobin 11.9, platelets of 287. ? ?High sensitive troponin was 4. D-dimer was 0.42. UA was negative for leukocytes and nitrates. ? ?LFT has been collected and pending. ? ?ED treatment: Protonix bolus and GTT initiated. ? ?5/9: Hemoglobin dropped to 10.1 this morning, baseline at 14.6. ?GI was consulted and he will be going for EGD later today. ?Patient did not had any bowel movement since in the hospital, last bowel movement was yesterday at home and it was black tarry in color. ?

## 2021-12-05 NOTE — ED Notes (Signed)
See triage note    States he noticed that  his stool was dark this am at work  states the became dizzy  and laid his head down    then he had a near syncopal episode when he stood up   ?

## 2021-12-06 ENCOUNTER — Encounter: Payer: Self-pay | Admitting: Internal Medicine

## 2021-12-06 ENCOUNTER — Observation Stay: Payer: 59 | Admitting: Anesthesiology

## 2021-12-06 ENCOUNTER — Encounter
Admission: EM | Disposition: A | Discharge status: Home / Self Care | Payer: Self-pay | Source: Home / Self Care | Attending: Internal Medicine

## 2021-12-06 DIAGNOSIS — K922 Gastrointestinal hemorrhage, unspecified: Secondary | ICD-10-CM | POA: Diagnosis not present

## 2021-12-06 HISTORY — PX: ESOPHAGOGASTRODUODENOSCOPY (EGD) WITH PROPOFOL: SHX5813

## 2021-12-06 LAB — CBC
HCT: 30.8 % — ABNORMAL LOW (ref 39.0–52.0)
Hemoglobin: 10.1 g/dL — ABNORMAL LOW (ref 13.0–17.0)
MCH: 27 pg (ref 26.0–34.0)
MCHC: 32.8 g/dL (ref 30.0–36.0)
MCV: 82.4 fL (ref 80.0–100.0)
Platelets: 244 10*3/uL (ref 150–400)
RBC: 3.74 MIL/uL — ABNORMAL LOW (ref 4.22–5.81)
RDW: 13.6 % (ref 11.5–15.5)
WBC: 8 10*3/uL (ref 4.0–10.5)
nRBC: 0 % (ref 0.0–0.2)

## 2021-12-06 LAB — BASIC METABOLIC PANEL
Anion gap: 5 (ref 5–15)
BUN: 25 mg/dL — ABNORMAL HIGH (ref 6–20)
CO2: 27 mmol/L (ref 22–32)
Calcium: 8.9 mg/dL (ref 8.9–10.3)
Chloride: 107 mmol/L (ref 98–111)
Creatinine, Ser: 1.02 mg/dL (ref 0.61–1.24)
GFR, Estimated: >60 mL/min (ref >60)
Glucose, Bld: 103 mg/dL — ABNORMAL HIGH (ref 70–99)
Potassium: 3.4 mmol/L — ABNORMAL LOW (ref 3.5–5.1)
Sodium: 139 mmol/L (ref 135–145)

## 2021-12-06 LAB — HIV ANTIBODY (ROUTINE TESTING W REFLEX): HIV Screen 4th Generation wRfx: NONREACTIVE

## 2021-12-06 SURGERY — ESOPHAGOGASTRODUODENOSCOPY (EGD) WITH PROPOFOL
Anesthesia: General

## 2021-12-06 MED ORDER — LIDOCAINE HCL (CARDIAC) PF 100 MG/5ML IV SOSY
PREFILLED_SYRINGE | INTRAVENOUS | Status: DC | PRN
Start: 2021-12-06 — End: 2021-12-06
  Administered 2021-12-06: 100 mg via INTRAVENOUS

## 2021-12-06 MED ORDER — SODIUM CHLORIDE 0.9 % IV SOLN: INTRAVENOUS | Status: DC

## 2021-12-06 MED ORDER — DEXMEDETOMIDINE HCL IN NACL 200 MCG/50ML IV SOLN
INTRAVENOUS | Status: DC | PRN
  Administered 2021-12-06: 12 ug via INTRAVENOUS

## 2021-12-06 MED ORDER — PROPOFOL 10 MG/ML IV BOLUS
INTRAVENOUS | Status: DC | PRN
  Administered 2021-12-06: 100 mg via INTRAVENOUS
  Administered 2021-12-06: 50 mg via INTRAVENOUS
  Administered 2021-12-06: 150 mg via INTRAVENOUS

## 2021-12-06 NOTE — Progress Notes (Signed)
Report given to Endo. Patient updated on plan of care.  ?
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
## 2021-12-06 NOTE — Care Plan (Signed)
See procedure report. Small ulcer with no high risk stigmata of severe bleeding. Continue PPI. Can be discharged from GI standpoint. ? ?Merlyn Lot MD, MPH ?[facility] ?

## 2021-12-06 NOTE — Transfer of Care (Signed)
Immediate Anesthesia Transfer of Care Note ? ?Patient: Grant Mays ? ?Procedure(s) Performed: ESOPHAGOGASTRODUODENOSCOPY (EGD) WITH PROPOFOL ? ?Patient Location: PACU and Endoscopy Unit ? ?Anesthesia Type:General ? ?Level of Consciousness: awake ? ?Airway & Oxygen Therapy: Patient Spontanous Breathing ? ?Post-op Assessment: Report given to RN ? ?Post vital signs: stable ? ?Last Vitals:  ?Vitals Value Taken Time  ?BP    ?Temp    ?Pulse    ?Resp    ?SpO2    ? ? ?Last Pain:  ?Vitals:  ? 12/06/21 1431  ?TempSrc: Temporal  ?PainSc: 2   ?   ? ?  ? ?Complications: No notable events documented. ?

## 2021-12-06 NOTE — Progress Notes (Signed)
?Progress Note ? ? ?Patient: Grant Mays [id number] DOB: 1981-04-15 DOA: 12/05/2021     0 ?DOS: the patient was seen and examined on 12/06/2021 ?  ?Brief hospital course: ?41 year old male with medical history of obesity, currently not prescribed medications and not taking medications, who presents emergency department for chief concerns of syncopal event at work. ? ?Initial vitals in the emergency department showed temperature of 98.3, respiration 18, heart rate 97, blood pressure 138/93, SPO2 99% on room air. ? ?Serum sodium 140, potassium 4.3, chloride 106, bicarb 26, BUN of 36, serum creatinine 0.96, GFR greater than 60, nonfasting blood glucose 108, WBC 11.2, hemoglobin 11.9, platelets of 287. ? ?High sensitive troponin was 4. D-dimer was 0.42. UA was negative for leukocytes and nitrates. ? ?LFT has been collected and pending. ? ?ED treatment: Protonix bolus and GTT initiated. ? ?5/9: Hemoglobin dropped to 10.1 this morning, baseline at 14.6. ?GI was consulted and he will be going for EGD later today. ?Patient did not had any bowel movement since in the hospital, last bowel movement was yesterday at home and it was black tarry in color. ? ? ?Assessment and Plan: ?* Upper GI bleed ?- Check H. pylori antigen stool and H. pylori IgM and IgG and IgA antibody ?- Continue Protonix GGT ?- Consulted GI specialist, Dr. Haig Prophet who is aware of patient ?-Patient is going for EGD later today. ?-Monitor hemoglobin ?-Transfuse if below 8 ? ?Acute blood loss anemia ?- Baseline hemoglobin was 14.8, on 08/18/2021 ?- Dropped to 10.1 this morning. ?-Monitor hemoglobin ?-Transfuse if below 8 ? ?Syncope ?- Presumed secondary to upper GI bleed ?- Currently at baseline. ?Chest x-ray without any significant abnormality. ? ?Chronic back pain ?- Lidocaine application of the back as needed ordered ? ?Obesity (BMI 30-39.9) ?Estimated body mass index is 39.74 kg/m? as calculated from the following: ?  Height as of this encounter: 6\' 8"   (2.032 m). ?  Weight as of this encounter: 164.1 kg. ? ?- Counseled patient on safe and healthy weight loss ?- Patient states he is trying, he states that his wife and his primary care doctor has been telling him that he needs to lose weight ? ? ?Subjective: Patient was seen and examined today.  Waiting for his EGD.  No other complaints.  Did not had any bowel movement since in the hospital. ? ?Physical Exam: ?Vitals:  ? 12/06/21 0630 12/06/21 0700 12/06/21 0800 12/06/21 1200  ?BP: 99/71 118/73 108/82 109/76  ?Pulse: 74 73 71 85  ?Resp: 16 16 16 14   ?Temp:      ?TempSrc:      ?SpO2: 97% 99% 98% 99%  ?Weight:      ?Height:      ? ?General.  Obese gentleman, in no acute distress. ?Pulmonary.  Lungs clear bilaterally, normal respiratory effort. ?CV.  Regular rate and rhythm, no JVD, rub or murmur. ?Abdomen.  Soft, nontender, nondistended, BS positive. ?CNS.  Alert and oriented .  No focal neurologic deficit. ?Extremities.  No edema, no cyanosis, pulses intact and symmetrical. ?Psychiatry.  Judgment and insight appears normal. ? ?Data Reviewed: ?Prior notes, labs and images reviewed ? ?Family Communication: Discussed with wife at bedside ? ?Disposition: ?Status is: Observation ?The patient remains OBS appropriate and will d/c before 2 midnights. ? Planned Discharge Destination: Home ? ?Time spent: 45 minutes ? ?This record has been created using Systems analyst. Errors have been sought and corrected,but may not always be located. Such creation errors do not reflect  on the standard of care. ? ?Author: ?Lorella Nimrod, MD ?12/06/2021 2:27 PM ? ?For on call review [email or web address].  ?

## 2021-12-06 NOTE — Op Note (Signed)
[facility] ?Gastroenterology ?Patient Name: Grant Mays ?Procedure Date: 12/06/2021 2:49 PM ?MRN: [id number] ?Account #: [id number] ?Date of Birth: 1981/06/19 ?Admit Type: Outpatient ?Age: 41 ?Room: [facility] ENDO ROOM 1 ?Gender: Male ?Note Status: Finalized ?Instrument Name: Upper Endoscope [id number] ?Procedure:             Upper GI endoscopy ?Indications:           Melena ?Providers:             Andrey Farmer MD, MD ?Medicines:             Monitored Anesthesia Care ?Complications:         No immediate complications. Estimated blood loss:  ?                       Minimal. ?Procedure:             Pre-Anesthesia Assessment: ?                       - Prior to the procedure, a History and Physical was  ?                       performed, and patient medications and allergies were  ?                       reviewed. The patient is competent. The risks and  ?                       benefits of the procedure and the sedation options and  ?                       risks were discussed with the patient. All questions  ?                       were answered and informed consent was obtained.  ?                       Patient identification and proposed procedure were  ?                       verified by the physician, the nurse, the  ?                       anesthesiologist, the anesthetist and the technician  ?                       in the endoscopy suite. Mental Status Examination:  ?                       alert and oriented. Airway Examination: normal  ?                       oropharyngeal airway and neck mobility. Respiratory  ?                       Examination: clear to auscultation. CV Examination:  ?                       normal. Prophylactic Antibiotics: The patient does not  ?  require prophylactic antibiotics. Prior  ?                       Anticoagulants: The patient has taken no previous  ?                       anticoagulant or antiplatelet agents. ASA Grade  ?                        Assessment: II - A patient with mild systemic disease.  ?                       After reviewing the risks and benefits, the patient  ?                       was deemed in satisfactory condition to undergo the  ?                       procedure. The anesthesia plan was to use monitored  ?                       anesthesia care (MAC). Immediately prior to  ?                       administration of medications, the patient was  ?                       re-assessed for adequacy to receive sedatives. The  ?                       heart rate, respiratory rate, oxygen saturations,  ?                       blood pressure, adequacy of pulmonary ventilation, and  ?                       response to care were monitored throughout the  ?                       procedure. The physical status of the patient was  ?                       re-assessed after the procedure. ?                       After obtaining informed consent, the endoscope was  ?                       passed under direct vision. Throughout the procedure,  ?                       the patient's blood pressure, pulse, and oxygen  ?                       saturations were monitored continuously. The Endoscope  ?                       was introduced through the mouth, and advanced to the  ?  second part of duodenum. The upper GI endoscopy was  ?                       accomplished without difficulty. The patient tolerated  ?                       the procedure well. ?Findings: ?     The examined esophagus was normal. ?     One non-bleeding superficial gastric ulcer with a clean ulcer base  ?     (Forrest Class III) was found at the pylorus. The lesion was 4 mm in  ?     largest dimension. Biopsies were taken with a cold forceps for  ?     Helicobacter pylori testing. Estimated blood loss was minimal. ?     The exam of the stomach was otherwise normal. ?     The examined duodenum was normal. ?Impression:            - Normal esophagus. ?                        - Non-bleeding gastric ulcer with a clean ulcer base  ?                       (Forrest Class III). Biopsied. ?                       - Normal examined duodenum. ?Recommendation:        - Return patient to hospital ward for possible  ?                       discharge same day. ?                       - Advance diet as tolerated. ?                       - Use Protonix (pantoprazole) 40 mg PO BID for 3  ?                       months. ?                       - Repeat upper endoscopy in 3 months to check healing. ?                       - Await pathology results. ?                       - Refer to a gastroenterologist at appointment to be  ?                       scheduled. ?Procedure Code(s):     --- Professional --- ?                       [id number], Esophagogastroduodenoscopy, flexible,  ?                       transoral; with biopsy, single or multiple ?Diagnosis Code(s):     --- Professional --- ?  K25.9, Gastric ulcer, unspecified as acute or chronic,  ?                       without hemorrhage or perforation ?                       K92.1, Melena (includes Hematochezia) ?CPT copyright 2019 [employer]. All rights reserved. ?The codes documented in this report are preliminary and upon coder review may  ?be revised to meet current compliance requirements. ?Andrey Farmer MD, MD ?12/06/2021 3:27:40 PM ?Number of Addenda: 0 ?Note Initiated On: 12/06/2021 2:49 PM ?Estimated Blood Loss:  Estimated blood loss was minimal. ?     [facility] ?
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
## 2021-12-07 ENCOUNTER — Encounter: Payer: Self-pay | Admitting: Gastroenterology

## 2021-12-07 DIAGNOSIS — K922 Gastrointestinal hemorrhage, unspecified: Secondary | ICD-10-CM | POA: Diagnosis not present

## 2021-12-07 LAB — CBC
HCT: 29.8 % — ABNORMAL LOW (ref 39.0–52.0)
Hemoglobin: 9.7 g/dL — ABNORMAL LOW (ref 13.0–17.0)
MCH: 27.2 pg (ref 26.0–34.0)
MCHC: 32.6 g/dL (ref 30.0–36.0)
MCV: 83.5 fL (ref 80.0–100.0)
Platelets: 245 10*3/uL (ref 150–400)
RBC: 3.57 MIL/uL — ABNORMAL LOW (ref 4.22–5.81)
RDW: 13.8 % (ref 11.5–15.5)
WBC: 6.2 10*3/uL (ref 4.0–10.5)
nRBC: 0 % (ref 0.0–0.2)

## 2021-12-07 MED ORDER — PANTOPRAZOLE SODIUM 40 MG PO TBEC: DELAYED_RELEASE_TABLET | ORAL | 0 refills | Status: AC

## 2021-12-07 NOTE — Anesthesia Postprocedure Evaluation (Signed)
Anesthesia Post Note ? ?Patient: Grant Mays ? ?Procedure(s) Performed: ESOPHAGOGASTRODUODENOSCOPY (EGD) WITH PROPOFOL ? ?Patient location during evaluation: Endoscopy ?Anesthesia Type: General ?Level of consciousness: awake and alert ?Pain management: pain level controlled ?Vital Signs Assessment: post-procedure vital signs reviewed and stable ?Respiratory status: spontaneous breathing, nonlabored ventilation, respiratory function stable and patient connected to nasal cannula oxygen ?Cardiovascular status: blood pressure returned to baseline and stable ?Postop Assessment: no apparent nausea or vomiting ?Anesthetic complications: no ? ? ?No notable events documented. ? ? ?Last Vitals:  ?Vitals:  ? 12/07/21 0400 12/07/21 0744  ?BP: 114/70 120/82  ?Pulse: 85 83  ?Resp: 16 18  ?Temp: 36.9 ?C 36.6 ?C  ?SpO2: 100% 100%  ?  ?Last Pain:  ?Vitals:  ? 12/07/21 0744  ?TempSrc: Oral  ?PainSc:   ? ? ?  ?  ?  ?  ?  ?  ? ?Lenard Simmer ? ? ? ? ?

## 2021-12-07 NOTE — Discharge Summary (Signed)
[facility] Discharge Summary ? ? ?Patient: Grant Mays KZS:[id number]  PCP: Jac Canavan, PA-C  ?Date of admission: 12/05/2021   Date of discharge:  12/07/2021   ?  ?Discharge Diagnoses:  ?Principal Problem: ?  Upper GI bleed ?Active Problems: ?  Acute blood loss anemia ?  Syncope ?  Chronic back pain ?  Obesity (BMI 30-39.9) ? ? ?Admitted From: Home ?Disposition:  Home  ? ?Recommendations for Outpatient Follow-up:  ?PCP: in  1wk ?Gi in 3 months to repeat EGD ?Follow up LABS/TEST: Repeat CBC after 1 week and anemia work-up as an outpatient ? ? ?Diet recommendation: Regular diet ? ?Activity: The patient is advised to gradually reintroduce usual activities, as tolerated ? ?Discharge Condition: stable ? ?Code Status: Full code  ? ?History of present illness: As per the H and P dictated on admission ? ?Hospital Course:  ?41 year old male with medical history of obesity, currently not prescribed medications and not taking medications, who presents emergency department for chief concerns of syncopal event at work. ?Initial vitals in the emergency department showed temperature of 98.3, respiration 18, heart rate 97, blood pressure 138/93, SPO2 99% on room air. ?Serum sodium 140, potassium 4.3, chloride 106, bicarb 26, BUN of 36, serum creatinine 0.96, GFR greater than 60, nonfasting blood glucose 108, WBC 11.2, hemoglobin 11.9, platelets of 287. ?High sensitive troponin was 4. D-dimer was 0.42. UA was negative for leukocytes and nitrates. ?LFT has been collected and pending. ? ED treatment: Protonix bolus and GTT initiated. ? 5/9: Hemoglobin dropped to 10.1 this morning, baseline at 14.6. ?GI was consulted and he will be going for EGD later today. ?Patient did not had any bowel movement since in the hospital, last bowel movement was yesterday at home and it was black tarry in color. ? ?Assessment and Plan: ?* Upper GI bleed, s/p PPI IV, GI consulted, s/p EGD, clean-based ulcer without any active bleeding.  GI  recommended PPI twice daily and follow H. pylori test, repeat EGD after 3 months. ?Patient was clinically stable, discharged to follow-up with PCP to repeat CBC after 1 week and anemia work-up as an outpatient.  Return back to ED if persistent GI bleeding.  ?Acute blood loss anemia, Baseline hemoglobin was 14.8, on 08/18/2021, Dropped to 10.1 this morning.  Hemoglobin 9.7 today, remained stable.  Repeat CBC after 1 week and follow with PCP ?Syncope, Presumed secondary to upper GI bleed, Currently at baseline and asymptomatic ?Chest x-ray without any significant abnormality. ?Chronic back pain, s/p Lidocaine application of the back as needed ordered during hospital stay.  Patient was advised to follow with PCP as an outpatient for further management ?Obesity (BMI 30-39.9), Estimated body mass index is 39.74 kg/m? as calculated from the following: ?  Height as of this encounter: 6\' 8"  (2.032 m). ?  Weight as of this encounter: 164.1 kg. ?  ?Body mass index is 39.66 kg/m?  ?Nutrition Interventions: ?  ? ?   ?Patient was ambulatory without any assistance. ?On the day of the discharge the patient's vitals were stable, and no other acute medical condition were reported by patient. the patient was felt safe to be discharge at Home . ? ?Consultants: GI ?Procedures: s/p EGD  ? ?Discharge Exam: ?General: Appear in no distress, no Rash; Oral Mucosa Clear, moist. ?Cardiovascular: S1 and S2 Present, no Murmur, ?Respiratory: normal respiratory effort, Bilateral Air entry present and no Crackles, no wheezes ?Abdomen: Bowel Sound present, Soft and no tenderness, no hernia ?Extremities: no Pedal edema,  no calf tenderness ?Neurology: alert and oriented to time, place, and person ?affect appropriate. ? ?Filed Weights  ? 12/05/21 1651 12/06/21 1431  ?Weight: (!) 164.1 kg (!) 163.7 kg  ? ?Vitals:  ? 12/07/21 0744 12/07/21 1240  ?BP: 120/82 126/76  ?Pulse: 83 86  ?Resp: 18 19  ?Temp: 97.9 ?F (36.6 ?C) 99.5 ?F (37.5 ?C)  ?SpO2: 100% 99%   ? ? ?DISCHARGE MEDICATION: ?Allergies as of 12/07/2021   ?No Known Allergies ?  ? ?  ?Medication List  ?  ? ?TAKE these medications   ? ?pantoprazole 40 MG tablet ?Commonly known as: Protonix ?Take 1 tablet (40 mg total) by mouth 2 (two) times daily for 30 days, THEN 1 tablet (40 mg total) daily. ?Start taking on: Dec 07, 2021 ?  ? ?  ? ?No Known Allergies ?Discharge Instructions   ? ? Call MD for:   Complete by: As directed ?  ? Dark stool and or Bleeding  ? Call MD for:  persistant nausea and vomiting   Complete by: As directed ?  ? Call MD for:  severe uncontrolled pain   Complete by: As directed ?  ? Diet - low sodium heart healthy   Complete by: As directed ?  ? Discharge instructions   Complete by: As directed ?  ? F/u with PCP in 1 wk, repeat CBC to check Hb ?F/u GI in 1-2 wks for H.Pylori and in 3 months for repeat EGD  ? Increase activity slowly   Complete by: As directed ?  ? ?  ? ? ?The results of significant diagnostics from this hospitalization (including imaging, microbiology, ancillary and laboratory) are listed below for reference.   ? ?Significant Diagnostic Studies: ?DG Chest Port 1 View ? ?Result Date: 12/05/2021 ?CLINICAL DATA:  Syncope. EXAM: PORTABLE CHEST 1 VIEW COMPARISON:  06/18/2005 FINDINGS: The heart size and mediastinal contours are within normal limits. There is no evidence of pulmonary edema, consolidation, pneumothorax, nodule or pleural fluid. The visualized skeletal structures are unremarkable. IMPRESSION: No active disease. Electronically Signed   By: Irish Lack M.D.   On: 12/05/2021 19:20   ? ?Microbiology: ?No results found for this or any previous visit (from the past 240 hour(s)).  ? ?Labs: ?CBC: ?Recent Labs  ?Lab 12/05/21 ?1547 12/06/21 ?0441 12/07/21 ?5056  ?WBC 11.2* 8.0 6.2  ?HGB 11.9* 10.1* 9.7*  ?HCT 36.3* 30.8* 29.8*  ?MCV 84.2 82.4 83.5  ?PLT 287 244 245  ? ?Basic Metabolic Panel: ?Recent Labs  ?Lab 12/05/21 ?1547 12/06/21 ?0441  ?NA 140 139  ?K 4.3 3.4*  ?CL 106  107  ?CO2 26 27  ?GLUCOSE 108* 103*  ?BUN 36* 25*  ?CREATININE 0.96 1.02  ?CALCIUM 9.4 8.9  ? ?Liver Function Tests: ?Recent Labs  ?Lab 12/05/21 ?1547  ?AST 22  ?ALT 24  ?ALKPHOS 51  ?BILITOT 0.6  ?PROT 7.0  ?ALBUMIN 4.0  ? ?No results for input(s): LIPASE, AMYLASE in the last 168 hours. ?No results for input(s): AMMONIA in the last 168 hours. ?Cardiac Enzymes: ?No results for input(s): CKTOTAL, CKMB, CKMBINDEX, TROPONINI in the last 168 hours. ?BNP (last 3 results) ?No results for input(s): BNP in the last 8760 hours. ?CBG: ?No results for input(s): GLUCAP in the last 168 hours. ? ?Time spent: 35 minutes ? ?Signed: ? ?Sonnia Strong Lucianne Muss  ?[facility] ? 12/07/2021 ?12:41 PM   ?

## 2021-12-09 LAB — H PYLORI, IGM, IGG, IGA AB
H Pylori IgG: 6.42 Index Value — ABNORMAL HIGH (ref 0.00–0.79)
H. Pylogi, Iga Abs: 14 units — ABNORMAL HIGH (ref 0.0–8.9)
H. Pylogi, Igm Abs: <9 units (ref 0.0–8.9)

## 2021-12-09 LAB — SURGICAL PATHOLOGY

## 2021-12-15 ENCOUNTER — Inpatient Hospital Stay: Payer: Self-pay | Admitting: Medical

## 2021-12-20 ENCOUNTER — Ambulatory Visit (INDEPENDENT_AMBULATORY_CARE_PROVIDER_SITE_OTHER): Payer: 59 | Admitting: Medical

## 2021-12-20 VITALS — BP 128/90 | HR 72 | Resp 18 | Ht >= 80 in | Wt 354.8 lb

## 2021-12-20 DIAGNOSIS — D649 Anemia, unspecified: Secondary | ICD-10-CM

## 2021-12-20 DIAGNOSIS — K253 Acute gastric ulcer without hemorrhage or perforation: Secondary | ICD-10-CM | POA: Diagnosis not present

## 2021-12-20 MED ORDER — BISMUTH/METRONIDAZ/TETRACYCLIN 140-125-125 MG PO CAPS: 3.0000 | ORAL_CAPSULE | Freq: Four times a day (QID) | ORAL | 0 refills | Status: AC

## 2021-12-20 NOTE — Patient Instructions (Addendum)
Helicobacter Pylori Infection Helicobacter pylori infection is a bacterial infection in the stomach. Long-term (chronic) infection can cause stomach irritation (gastritis), ulcers in the stomach (gastric ulcers), and ulcers in the upper part of the intestine (duodenal ulcers). Having this infection may also increase your risk of stomach cancer and a type of white blood cell cancer (lymphoma) that affects the stomach. What are the causes? This infection is caused by the Helicobacter pylori (H. pylori) bacteria. Many healthy people have this bacteria in their stomach lining. The bacteria may also spread from person to person through contact with stool (feces) or saliva. It is not known why some people develop ulcers, gastritis, or cancer from the bacteria. What increases the risk? You are more likely to develop this condition if you: Have family members with the infection. Live with many other people, such as in a dormitory. What are the signs or symptoms? Most people with this infection do not have any symptoms. If you do have symptoms, they may include: Heartburn. Stomach pain. Nausea. Vomiting. The vomit may be bloody because of ulcers. Loss of appetite. Bad breath. How is this diagnosed? This condition may be diagnosed based on: Your symptoms and medical history. A physical exam. Blood tests. Stool tests. A breath test. A procedure that involves placing a tube with a camera on the end of it down your throat to examine your stomach and upper intestine (upper endoscopy). Removing and testing a tissue sample from the stomach lining (biopsy). A biopsy may be taken during an upper endoscopy. How is this treated?  This condition is treated by taking a combination of medicines (triple therapy) for several weeks. Triple therapy includes one medicine to reduce the amount of acid in your stomach and two types of antibiotic medicines. This treatment may reduce your risk of cancer. You may need to  be tested for H. pylori again after treatment. In some cases, the treatment may need to be repeated if your treatment did not get rid of all the bacteria. Follow these instructions at home:  Take over-the-counter and prescription medicines only as told by your health care provider. Take your antibiotic medicine as told by your health care provider. Do not stop taking the antibiotics even if you start to feel better. Return to your normal activities as told by your health care provider. Ask your health care provider what activities are safe for you. Take steps to prevent future infections: Wash your hands often with soap and water for at least 20 seconds. If soap and water are not available, use hand sanitizer. Do not eat food or drink water that may have had contact with stool or saliva. Keep all follow-up visits. This is important. You may need tests to make sure your treatment worked. Contact a health care provider if your symptoms: Do not get better with treatment. Return after treatment. Summary Helicobacter pylori infection is a stomach infection caused by the Helicobacter pylori (H. pylori) bacteria. This infection can cause stomach irritation (gastritis), ulcers in the stomach (gastric ulcers), and ulcers in the upper part of the intestine (duodenal ulcers). This condition is treated by taking a combination of medicines (triple therapy) for several weeks. Take your antibiotic medicine as told by your health care provider. Do not stop taking the antibiotics even if you start to feel better. This information is not intended to replace advice given to you by your health care provider. Make sure you discuss any questions you have with your health care provider. Document Revised:  02/02/2021 Document Reviewed: 02/02/2021 Elsevier Patient Education  2023 [employer]      Peptic Ulcer  A peptic ulcer is a painful sore in the lining of your stomach or the first part of your small  intestine. What are the causes? Common causes of this condition include: An infection. Using certain pain medicines too often or too much. Rare tumors in the stomach, small intestine, or pancreas. What increases the risk? You are more likely to get this condition if you: Smoke. Have a family history of ulcer disease. Drink alcohol. Have been hospitalized in an intensive care unit (ICU). What are the signs or symptoms? Symptoms include: Burning pain in the area between the chest and the belly button. The pain may: Not go away (be persistent). Be worse when your stomach is empty. Be worse at night. Heartburn. Feeling sick to your stomach (nauseous) and throwing up (vomiting). Bloating. If the ulcer results in bleeding, it can cause you to: Have poop (stool) that is black and looks like tar. Throw up bright red blood. Throw up material that looks like coffee grounds. How is this treated? Treatment for this condition may include: Stopping things that can cause the ulcer, such as: Smoking. Using pain medicines. Drinking alcohol or caffeine. Medicines to reduce stomach acid. Antibiotic medicines if the ulcer is caused by an infection. A procedure that is done using a small, flexible tube that has a camera at the end (upper endoscopy). This may be done if you have a bleeding ulcer. Surgery. This may be needed if: You have a lot of bleeding. The ulcer caused a hole somewhere in the digestive system. Follow these instructions at home: Do not drink alcohol if your doctor tells you not to drink. Limit how much caffeine you take in. Do not smoke or use any products that contain nicotine or tobacco. If you need help quitting, ask your doctor. Take over-the-counter and prescription medicines only as told by your doctor. Do not stop or change your medicines unless you talk with your doctor about it first. Do not take aspirin, ibuprofen, or other NSAIDs unless your doctor told you to do  so. Keep all follow-up visits. Contact a doctor if: You do not get better in 7 days after you start treatment. You keep having an upset stomach (indigestion) or heartburn. Get help right away if: You have sudden, sharp pain in your belly (abdomen). You have belly pain that does not go away. You have bloody poop (stool) or black, tarry poop. You throw up blood. It may look like coffee grounds. You feel light-headed or feel like you may pass out (faint). You get weak. You get sweaty or feel sticky and cold to the touch (clammy). These symptoms may be an emergency. Get help right away. Call 911. Do not wait to see if the symptoms will go away. Do not drive yourself to the hospital. Summary Symptoms of a peptic ulcer include burning pain in the area between the chest and the belly button. Do not smoke or use any products that contain nicotine or tobacco. If you need help quitting, ask your doctor. Take medicines only as told by your doctor. Limit how much alcohol and caffeine you have. Keep all follow-up visits. This information is not intended to replace advice given to you by your health care provider. Make sure you discuss any questions you have with your health care provider. Document Revised: 02/25/2021 Document Reviewed: 02/25/2021 Elsevier Patient Education  2023 [employer].
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
## 2021-12-21 ENCOUNTER — Other Ambulatory Visit: Payer: Self-pay | Admitting: Medical

## 2021-12-21 LAB — IRON,TIBC AND FERRITIN PANEL
Ferritin: 24 ng/mL — ABNORMAL LOW (ref 30–400)
Iron Saturation: 8 % — CL (ref 15–55)
Iron: 28 ug/dL — ABNORMAL LOW (ref 38–169)
Total Iron Binding Capacity: 367 ug/dL (ref 250–450)
UIBC: 339 ug/dL (ref 111–343)

## 2021-12-21 LAB — CBC WITH DIFFERENTIAL/PLATELET
Basophils Absolute: 0 10*3/uL (ref 0.0–0.2)
Basos: 1 %
EOS (ABSOLUTE): 0.2 10*3/uL (ref 0.0–0.4)
Eos: 3 %
Hematocrit: 32.5 % — ABNORMAL LOW (ref 37.5–51.0)
Hemoglobin: 10.2 g/dL — ABNORMAL LOW (ref 13.0–17.7)
Immature Grans (Abs): 0 10*3/uL (ref 0.0–0.1)
Immature Granulocytes: 0 %
Lymphocytes Absolute: 2.1 10*3/uL (ref 0.7–3.1)
Lymphs: 40 %
MCH: 26.4 pg — ABNORMAL LOW (ref 26.6–33.0)
MCHC: 31.4 g/dL — ABNORMAL LOW (ref 31.5–35.7)
MCV: 84 fL (ref 79–97)
Monocytes Absolute: 0.5 10*3/uL (ref 0.1–0.9)
Monocytes: 10 %
Neutrophils Absolute: 2.4 10*3/uL (ref 1.4–7.0)
Neutrophils: 46 %
Platelets: 451 10*3/uL — ABNORMAL HIGH (ref 150–450)
RBC: 3.87 x10E6/uL — ABNORMAL LOW (ref 4.14–5.80)
RDW: 13.7 % (ref 11.6–15.4)
WBC: 5.3 10*3/uL (ref 3.4–10.8)

## 2021-12-21 LAB — VITAMIN B12: Vitamin B-12: 832 pg/mL (ref 232–1245)

## 2021-12-21 LAB — FOLATE: Folate: 13.5 ng/mL (ref >3.0)

## 2021-12-21 LAB — RETICULOCYTES: Retic Ct Pct: 3.1 % — ABNORMAL HIGH (ref 0.6–2.6)

## 2021-12-21 MED ORDER — FERROUS GLUCONATE 324 (38 FE) MG PO TABS: 324.0000 mg | ORAL_TABLET | Freq: Two times a day (BID) | ORAL | 2 refills | Status: AC

## 2021-12-30 ENCOUNTER — Inpatient Hospital Stay: Payer: Self-pay | Admitting: Medical

## 2022-01-09 ENCOUNTER — Ambulatory Visit: Payer: 59 | Admitting: Medical

## 2022-01-09 ENCOUNTER — Encounter: Payer: Self-pay | Admitting: Medical

## 2022-01-09 VITALS — BP 124/82 | HR 84 | Ht >= 80 in | Wt 354.6 lb

## 2022-01-09 DIAGNOSIS — E669 Obesity, unspecified: Secondary | ICD-10-CM

## 2022-01-09 DIAGNOSIS — K279 Peptic ulcer, site unspecified, unspecified as acute or chronic, without hemorrhage or perforation: Secondary | ICD-10-CM

## 2022-01-09 DIAGNOSIS — D62 Acute posthemorrhagic anemia: Secondary | ICD-10-CM

## 2022-01-09 DIAGNOSIS — B9681 Helicobacter pylori [H. pylori] as the cause of diseases classified elsewhere: Secondary | ICD-10-CM | POA: Insufficient documentation

## 2022-01-09 DIAGNOSIS — G8929 Other chronic pain: Secondary | ICD-10-CM

## 2022-01-09 DIAGNOSIS — R7301 Impaired fasting glucose: Secondary | ICD-10-CM | POA: Insufficient documentation

## 2022-01-09 DIAGNOSIS — M549 Dorsalgia, unspecified: Secondary | ICD-10-CM

## 2022-01-09 LAB — CBC WITH DIFFERENTIAL/PLATELET
Basophils Absolute: 0 10*3/uL (ref 0.0–0.2)
Basos: 1 %
EOS (ABSOLUTE): 0.1 10*3/uL (ref 0.0–0.4)
Eos: 3 %
Hematocrit: 36.5 % — ABNORMAL LOW (ref 37.5–51.0)
Hemoglobin: 11.6 g/dL — ABNORMAL LOW (ref 13.0–17.7)
Immature Grans (Abs): 0 10*3/uL (ref 0.0–0.1)
Immature Granulocytes: 0 %
Lymphocytes Absolute: 1.8 10*3/uL (ref 0.7–3.1)
Lymphs: 39 %
MCH: 26.2 pg — ABNORMAL LOW (ref 26.6–33.0)
MCHC: 31.8 g/dL (ref 31.5–35.7)
MCV: 82 fL (ref 79–97)
Monocytes Absolute: 0.5 10*3/uL (ref 0.1–0.9)
Monocytes: 10 %
Neutrophils Absolute: 2.1 10*3/uL (ref 1.4–7.0)
Neutrophils: 47 %
Platelets: 330 10*3/uL (ref 150–450)
RBC: 4.43 x10E6/uL (ref 4.14–5.80)
RDW: 14 % (ref 11.6–15.4)
WBC: 4.6 10*3/uL (ref 3.4–10.8)

## 2022-01-09 MED ORDER — WEGOVY 0.25 MG/0.5ML ~~LOC~~ SOAJ: 0.2500 mg | SUBCUTANEOUS | 0 refills | Status: AC

## 2022-01-09 NOTE — Addendum Note (Signed)
Addended by: Minette Headland A on: 01/09/2022 03:10 PM   Modules accepted: Orders
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
## 2022-01-27 ENCOUNTER — Telehealth: Payer: Self-pay | Admitting: Medical

## 2022-01-27 NOTE — Telephone Encounter (Signed)
P.A. WEGOVY completed and faxed

## 2022-02-03 NOTE — Telephone Encounter (Signed)
Grant Mays is not covered under pt's insurance benefits so Prior Berkley Harvey has been denied. Please advise

## 2022-02-04 NOTE — Telephone Encounter (Signed)
P.A. Reginal Lutes denied, plan exclusion.

## 2022-02-14 NOTE — Telephone Encounter (Signed)
Plan exclusion 

## 2022-03-23 ENCOUNTER — Encounter: Payer: Self-pay | Admitting: Registered"

## 2022-03-23 ENCOUNTER — Encounter: Payer: 59 | Attending: Medical | Admitting: Registered"

## 2022-03-23 DIAGNOSIS — E669 Obesity, unspecified: Secondary | ICD-10-CM | POA: Diagnosis present

## 2022-03-23 NOTE — Progress Notes (Signed)
Medical Nutrition Therapy  Appointment Start time:  1121  Appointment End time:  1221  Primary concerns today: Nutrition and physical activity to lose wt.   Referral diagnosis: Obesity Preferred learning style: no preference indicated Learning readiness: ready  NUTRITION ASSESSMENT   Anthropometrics  Weight: 355.5 lb Height: 6\' 8"    Pt reports weight fluctuates over 10 lb range. Reports in past was 285-299 lb.    Clinical Medical Hx: prediabetes, elevated cholesterol, hx H. Pylori.  Medications: N/A Labs:  08/18/21:  HgbA1c: 5.9 LDL Cholesterol: 145  Notable Signs/Symptoms: None reported.   Lifestyle & Dietary Hx Pt reports main concerns include what type of diet and activities he needs to do for wt loss. Reports getting in 2 meals and some snacks. Snacks include "junk." Beverages include 48 oz water, juice (unsure of amount but less than water). Reports he cut back on soda some (less than amount of water included now).  Pt reports likes most vegetables but needs to increase consistency of how often he eats them.   Social/Other: Pt sometimes works second shift. Pt works at [employer]. Pt usually works 1st shift 630 AM-7 PM (alternates days on and off). Has been working night shift lately to help out but will return to first shift on Saturday.   Estimated daily fluid intake: 48 oz water Supplements: None reported.  Sleep: No issues reported. Reports sometimes gets very tired during day. Reports wife has told him he snores. Reports he gets between 5-7 hours sleep nightly.  Stress / self-care: 5-7 level stress reported. Reports he tries not to get stressed as much.  Current average weekly physical activity: None reported. Reports when he was younger he would get concert videos and dance with them for activity and was active in basketball but none since adulthood. He would like to start back with dancing to music concert videos and do it with his kids.   24-Hr Dietary Recall:  Woke up around 05-1129 AM First Meal: None reported.  Snack: None reported.  Second Meal 05-1129 AM:  1 bowl of Fruit Loops cereal with whole milk Snack: None reported.  Third Meal 8 PM: [employer]: tenders x 3 with honey mustard, fries, fruit punch  Snack: None reported.  Beverages: water, fruit punch   NUTRITION DIAGNOSIS  NB-1.1 Food and nutrition-related knowledge deficit As related to no prior nutrition education by dietitian reported.  As evidenced by pt referred for nutrition education by dietitian.   NUTRITION INTERVENTION  Nutrition education (E-1) on the following topics:  Balanced nutrition and mindful eating Prediabetes/insulin resistance and relationship between blood sugar and dietary intake and activity  Worked with pt to set pt led goals   Handouts Provided Include  Balanced plate and food list. Balanced snack sheet.   Learning Style & Readiness for Change Teaching method utilized: Visual & Auditory  Demonstrated degree of understanding via: Teach Back  Barriers to learning/adherence to lifestyle change: None reported.   Goals Established by Pt  Instructions/Goals:  Goal #1: Include 3 meals per day. Try to have balanced meals like the My Plate example (see handout). Include lean proteins, vegetables, fruits, and whole grains at meals.    Goal #2: Include non-starchy vegetables at lunch and dinner.  Water Goal: 80 oz or more each day.  Make physical activity a part of your week. Try to include at least 30 minutes of physical activity 5 days each week or at least 150 minutes per week. Regular physical activity promotes overall health-including helping  to reduce risk for heart disease and diabetes, promoting mental health, and helping [place] sleep better.    Goal #3: Dance regularly each week.    MONITORING & EVALUATION Dietary intake, weekly physical activity in 10 weeks.  Next Steps  See goals

## 2022-03-23 NOTE — Patient Instructions (Addendum)
Instructions/Goals:  Goal #1: Include 3 meals per day. Try to have balanced meals like the My Plate example (see handout). Include lean proteins, vegetables, fruits, and whole grains at meals.    Goal #2: Include non-starchy vegetables at lunch and dinner.  Water Goal: 80 oz or more each day.  Make physical activity a part of your week. Try to include at least 30 minutes of physical activity 5 days each week or at least 150 minutes per week. Regular physical activity promotes overall health-including helping to reduce risk for heart disease and diabetes, promoting mental health, and helping [place] sleep better.    Goal #3: Dance regularly each week.

## 2022-03-29 ENCOUNTER — Encounter: Payer: Self-pay | Admitting: Registered"

## 2022-06-13 ENCOUNTER — Ambulatory Visit: Payer: BC Managed Care – PPO | Admitting: Registered"

## 2022-10-05 ENCOUNTER — Ambulatory Visit: Payer: 59 | Admitting: Medical

## 2023-05-26 IMAGING — DX DG CHEST 1V PORT
1 series · 1 of 1 positions shown · non-contrast
Comparison: 06/18/2005

CLINICAL DATA: Syncope.

EXAM:
PORTABLE CHEST 1 VIEW

[chest ap]
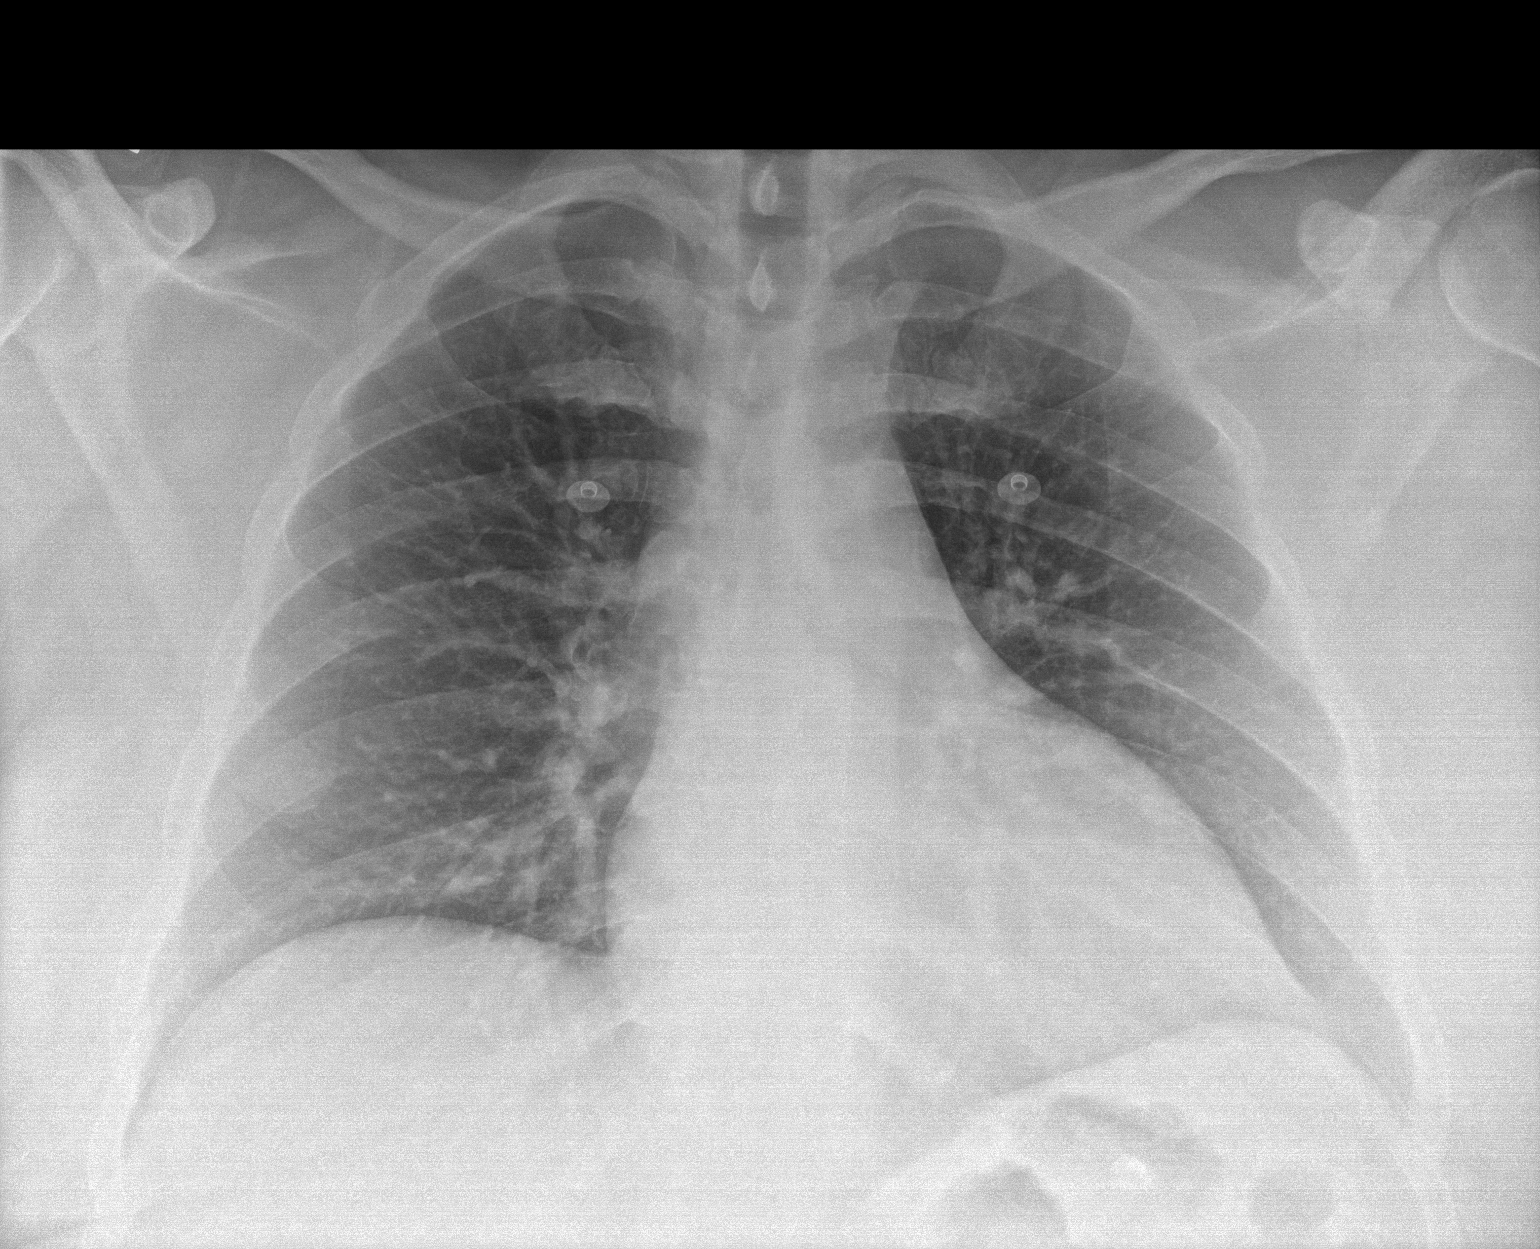

[1 of 1 positions shown; findings below may reference images not displayed]

FINDINGS: The heart size and mediastinal contours are within normal limits.
There is no evidence of pulmonary edema, consolidation,
pneumothorax, nodule or pleural fluid. The visualized skeletal
structures are unremarkable.
IMPRESSION: No active disease.

## 2024-04-14 ENCOUNTER — Encounter: Payer: Self-pay | Admitting: *Deleted

## 2024-04-14 ENCOUNTER — Ambulatory Visit: Admission: EM | Admit: 2024-04-14 | Discharge: 2024-04-14 | Disposition: A | Discharge status: Home / Self Care

## 2024-04-14 ENCOUNTER — Other Ambulatory Visit: Payer: Self-pay

## 2024-04-14 DIAGNOSIS — R112 Nausea with vomiting, unspecified: Secondary | ICD-10-CM

## 2024-04-14 MED ORDER — ONDANSETRON 8 MG PO TBDP: 8.0000 mg | ORAL_TABLET | Freq: Three times a day (TID) | ORAL | 0 refills | Status: AC | PRN

## 2024-04-14 NOTE — Discharge Instructions (Addendum)
 You were seen today for nausea and vomiting that began after starting Wegovy , which is likely the cause of your symptoms. You were prescribed Zofran  to help control nausea, and you should take it as directed if symptoms return. Try to sip small amounts of clear fluids such as water, electrolyte drinks, or clear broth frequently to stay hydrated. When you feel ready to eat, start with bland foods such as crackers, toast, rice, or bananas, and avoid greasy, spicy, or heavy meals until your stomach settles. Get plenty of rest, as fatigue can make nausea worse.  Please follow up with your primary care provider to review your symptoms and discuss whether you should continue Wegovy  or adjust your treatment plan. Go to the emergency department immediately if you are unable to keep fluids down, develop persistent or severe abdominal pain, have repeated vomiting, notice blood in your vomit, develop a fever, or if your symptoms suddenly worsen.
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
## 2024-04-14 NOTE — ED Triage Notes (Signed)
 Pt states he is on wegovy  shots. While driving to work tonight he got sick and had to pull over to vomit. He had already zofran  prior to leaving for work. He needs a note for work. Denies fever or other symptoms
